# Patient Record
Sex: Female | Born: 1935 | Race: Black or African American | Hispanic: No | Smoking: Never smoker
Health system: Southern US, Community
[De-identification: ages and names within clinical notes are randomized; demographics above are authoritative.]

## PROBLEM LIST (undated history)

## (undated) DIAGNOSIS — M6281 Muscle weakness (generalized): Secondary | ICD-10-CM

## (undated) DIAGNOSIS — A809 Acute poliomyelitis, unspecified: Secondary | ICD-10-CM

## (undated) DIAGNOSIS — I509 Heart failure, unspecified: Secondary | ICD-10-CM

## (undated) DIAGNOSIS — Z8781 Personal history of (healed) traumatic fracture: Secondary | ICD-10-CM

## (undated) DIAGNOSIS — L8990 Pressure ulcer of unspecified site, unspecified stage: Secondary | ICD-10-CM

## (undated) DIAGNOSIS — E785 Hyperlipidemia, unspecified: Secondary | ICD-10-CM

## (undated) DIAGNOSIS — I639 Cerebral infarction, unspecified: Secondary | ICD-10-CM

## (undated) DIAGNOSIS — I4891 Unspecified atrial fibrillation: Secondary | ICD-10-CM

## (undated) DIAGNOSIS — M1990 Unspecified osteoarthritis, unspecified site: Secondary | ICD-10-CM

## (undated) DIAGNOSIS — I10 Essential (primary) hypertension: Secondary | ICD-10-CM

## (undated) DIAGNOSIS — R1310 Dysphagia, unspecified: Secondary | ICD-10-CM

## (undated) DIAGNOSIS — G809 Cerebral palsy, unspecified: Secondary | ICD-10-CM

## (undated) DIAGNOSIS — K219 Gastro-esophageal reflux disease without esophagitis: Secondary | ICD-10-CM

## (undated) DIAGNOSIS — E46 Unspecified protein-calorie malnutrition: Secondary | ICD-10-CM

## (undated) DIAGNOSIS — M810 Age-related osteoporosis without current pathological fracture: Secondary | ICD-10-CM

## (undated) DIAGNOSIS — R296 Repeated falls: Secondary | ICD-10-CM

## (undated) HISTORY — PX: HIP PINNING: SHX1757

---

## 2002-12-26 ENCOUNTER — Ambulatory Visit (HOSPITAL_COMMUNITY): Admission: RE | Admit: 2002-12-26 | Discharge: 2002-12-26 | Payer: Self-pay | Admitting: Pulmonary Disease

## 2004-03-12 ENCOUNTER — Ambulatory Visit (HOSPITAL_COMMUNITY): Admission: RE | Admit: 2004-03-12 | Discharge: 2004-03-12 | Payer: Self-pay | Admitting: Pulmonary Disease

## 2005-01-26 ENCOUNTER — Ambulatory Visit (HOSPITAL_COMMUNITY): Admission: RE | Admit: 2005-01-26 | Discharge: 2005-01-26 | Payer: Self-pay | Admitting: Pulmonary Disease

## 2010-03-13 ENCOUNTER — Emergency Department (HOSPITAL_COMMUNITY)
Admission: EM | Admit: 2010-03-13 | Discharge: 2010-03-13 | Payer: Self-pay | Source: Home / Self Care | Admitting: Emergency Medicine

## 2010-03-14 ENCOUNTER — Emergency Department (HOSPITAL_COMMUNITY)
Admission: EM | Admit: 2010-03-14 | Discharge: 2010-03-14 | Payer: Self-pay | Source: Home / Self Care | Admitting: Physician Assistant

## 2010-03-24 ENCOUNTER — Inpatient Hospital Stay (HOSPITAL_COMMUNITY)
Admission: AD | Admit: 2010-03-24 | Discharge: 2010-03-27 | Payer: Self-pay | Source: Home / Self Care | Attending: Pulmonary Disease | Admitting: Pulmonary Disease

## 2010-03-24 LAB — CBC
HCT: 41.8 % (ref 36.0–46.0)
Hemoglobin: 13.8 g/dL (ref 12.0–15.0)
MCH: 27.8 pg (ref 26.0–34.0)
MCHC: 33 g/dL (ref 30.0–36.0)
MCV: 84.3 fL (ref 78.0–100.0)
Platelets: 272 10*3/uL (ref 150–400)
RBC: 4.96 MIL/uL (ref 3.87–5.11)
RDW: 13.7 % (ref 11.5–15.5)
WBC: 4 10*3/uL (ref 4.0–10.5)

## 2010-03-24 LAB — BRAIN NATRIURETIC PEPTIDE: Pro B Natriuretic peptide (BNP): 57.7 pg/mL (ref 0.0–100.0)

## 2010-03-24 LAB — COMPREHENSIVE METABOLIC PANEL
ALT: 12 U/L (ref 0–35)
AST: 18 U/L (ref 0–37)
Albumin: 3.5 g/dL (ref 3.5–5.2)
Alkaline Phosphatase: 165 U/L — ABNORMAL HIGH (ref 39–117)
BUN: 16 mg/dL (ref 6–23)
CO2: 31 mEq/L (ref 19–32)
Calcium: 10 mg/dL (ref 8.4–10.5)
Chloride: 109 mEq/L (ref 96–112)
Creatinine, Ser: 0.84 mg/dL (ref 0.4–1.2)
GFR calc Af Amer: >60 mL/min (ref >60)
GFR calc non Af Amer: >60 mL/min (ref >60)
Glucose, Bld: 87 mg/dL (ref 70–99)
Potassium: 5.6 mEq/L — ABNORMAL HIGH (ref 3.5–5.1)
Sodium: 142 mEq/L (ref 135–145)
Total Bilirubin: 0.4 mg/dL (ref 0.3–1.2)
Total Protein: 7.5 g/dL (ref 6.0–8.3)

## 2010-03-24 LAB — DIFFERENTIAL
Basophils Absolute: 0 10*3/uL (ref 0.0–0.1)
Basophils Relative: 0 % (ref 0–1)
Eosinophils Absolute: 0.1 10*3/uL (ref 0.0–0.7)
Eosinophils Relative: 2 % (ref 0–5)
Lymphocytes Relative: 29 % (ref 12–46)
Lymphs Abs: 1.2 10*3/uL (ref 0.7–4.0)
Monocytes Absolute: 0.4 10*3/uL (ref 0.1–1.0)
Monocytes Relative: 10 % (ref 3–12)
Neutro Abs: 2.4 10*3/uL (ref 1.7–7.7)
Neutrophils Relative %: 60 % (ref 43–77)

## 2010-03-26 ENCOUNTER — Encounter: Payer: Self-pay | Admitting: Orthopedic Surgery

## 2010-03-27 ENCOUNTER — Encounter: Payer: Self-pay | Admitting: Orthopedic Surgery

## 2010-03-27 LAB — POTASSIUM: Potassium: 3.6 mEq/L (ref 3.5–5.1)

## 2010-03-27 NOTE — Progress Notes (Signed)
  NAME:  Traci Martin                ACCOUNT NO.:  [id number]  MEDICAL RECORD NO.:  [id number]          PATIENT TYPE:  INP  LOCATION:  A318                          FACILITY:  [facility]  PHYSICIAN:  Joshuah Minella L. Juanetta Gosling, M.D.DATE OF BIRTH:  1924/11/17  DATE OF PROCEDURE: DATE OF DISCHARGE:                                PROGRESS NOTE   Traci Martin is doing fairly well.  She had difficulty bearing weight yesterday.  I had another x-ray made of her femur.  I have discussed her situation with Dr. Romeo Apple who has seen her in consult and he does not see a fracture but we are still concerned about the fact that she is not able to bear weight which she had been able to do in the past.  We are going to see about getting a bone scan.  She is being set up for placement and I think that may be able to be accomplished in the next 24 hours depending on the results of the bone scan.  Her exam shows that she is awake and alert, mildly confused.  Temperature is 98.8, pulse 85, respirations 16, blood pressure 139/86, O2 sats 96% on room air.  Her chest is clear.  Her heart is regular.  Her abdomen is soft.  Assessment then she has multiple medical problems, history of congestive heart failure which seems to be pretty inactive at this point, at least she is not having active symptoms.  She has failure to thrive.  She has inability to ambulate.  She has cerebral palsy and my plan will be to continue with her treatments etc for the bone scan potential discharge tomorrow.     Traci Martin L. Juanetta Gosling, M.D.     ELH/MEDQ  D:  03/26/2010  T:  03/26/2010  Job:  [id number]  Electronically Signed by Kari Baars M.D. on 03/27/2010 10:54:31 AM

## 2010-03-27 NOTE — H&P (Signed)
  NAME:  [name], [name]                ACCOUNT NO.:  [id number]  MEDICAL RECORD NO.:  [id number]          PATIENT TYPE:  INP  LOCATION:  A318                          FACILITY:  [facility]  PHYSICIAN:  Stetson Pelaez L. Juanetta Gosling, M.D.DATE OF BIRTH:  1924-08-13  DATE OF ADMISSION:  03/24/2010 DATE OF DISCHARGE:  LH                             HISTORY & PHYSICAL   Traci Martin is admitted because of failure to thrive and uncontrolled pain. She is 77 year old.  She had apparently fallen at home about 10 days and 2 weeks ago.  She lost her balance and she went to the emergency room, had x-rays there which were negative.  Since that time, she has continued to have pain which is not controlled.  I saw her in my office last week, arranged for home health to go and help, they found that she was alone, unable to get up and she had loss control of her bowels.  She is being admitted for pain control and failure to thrive.  Her past medical history is positive for hyperlipidemia, hypertension, congestive heart failure, cerebral palsy and falls.  Her family history is positive for anemia and coronary artery disease.  Surgically, she has had a hip replacement.  SOCIAL HISTORY:  She is living alone which I think is unsafe for her. She does not use any tobacco, alcohol or illicit drugs.  Her medications are unclear at this point.  Review of systems except as mentioned is negative.  Physical examination shows that she appears to be mildly confused, blood pressure 120/74, pulse is 80 and respirations were 16.  Her pupils are reactive.  She has some disconjugate gaze.  Her heart is regular. Abdomen is soft.  Bowel sounds present and active.  Her chest is clear without wheezes, rales or rhonchi.  She has chronic abnormalities in her central nervous system examination with the cerebral palsy.  Assessment then is that she has pain which is uncontrolled.  She has failure to thrive.  I am going to bring her in the  hospital, put her on IV pain medication, try to get her pain control.  I am going to see if we get PT, etc. to see her and continue with everything else in the meantime.     Brinleigh Tew L. Juanetta Gosling, M.D.     ELH/MEDQ  D:  03/24/2010  T:  03/25/2010  Job:  [id number]  Electronically Signed by Kari Baars M.D. on 03/27/2010 10:54:20 AM

## 2010-03-27 NOTE — Progress Notes (Signed)
  NAME:  Traci, Martin                ACCOUNT NO.:  [id number]  MEDICAL RECORD NO.:  [id number]          PATIENT TYPE:  INP  LOCATION:  A318                          FACILITY:  [facility]  PHYSICIAN:  Edward L. Juanetta Gosling, M.D.DATE OF BIRTH:  Dec 20, 1924  DATE OF PROCEDURE: DATE OF DISCHARGE:                                PROGRESS NOTE   [name] was admitted yesterday with pain from a fall, failure to thrive.  She seems to be a little bit better this morning.  PHYSICAL EXAMINATION:  VITAL SIGNS:  She has a temperature of 98.8, pulse 87, respirations 18, blood pressure 158/83, O2 sats 97%. GENERAL:  She is still complaining of pain.  She is awake and alert. She looks a little better than last night.  ASSESSMENT:  I think she is improving.  Plan is to continue with her current treatments and medications.  No changes today.  Continue IV fluids, etc.     Edward L. Juanetta Gosling, M.D.     ELH/MEDQ  D:  03/25/2010  T:  03/26/2010  Job:  [id number] Electronically Signed by Kari Baars M.D. on 03/27/2010 10:54:24 AM

## 2010-03-29 NOTE — Progress Notes (Addendum)
  NAME:  Traci Martin, Traci Martin                ACCOUNT NO.:  [id number]  MEDICAL RECORD NO.:  [id number]          PATIENT TYPE:  INP  LOCATION:  A318                          FACILITY:  [facility]  PHYSICIAN:  Vickki Hearing, M.D.DATE OF BIRTH:  07/03/1924  DATE OF PROCEDURE: DATE OF DISCHARGE:                                PROGRESS NOTE   ADDENDUM:  Bone scan was done today.  The patient has increased uptake in the left pelvis.  Based on her clinical history of fall, inability to weight bear on the left side, I suspect that she has a left pelvic fracture and we will treat it as such.     Vickki Hearing, M.D.     SEH/MEDQ  D:  03/26/2010  T:  03/27/2010  Job:  [id number]  Electronically Signed by Fuller Canada M.D. on 03/29/2010 04:43:42 PM

## 2010-03-29 NOTE — Consult Note (Addendum)
NAME:  Traci Martin, STAPP                ACCOUNT NO.:  [id number]  MEDICAL RECORD NO.:  [id number]          PATIENT TYPE:  INP  LOCATION:  A318                          FACILITY:  [facility]  PHYSICIAN:  Vickki Hearing, M.D.DATE OF BIRTH:  Nov 28, 1924  DATE OF CONSULTATION:  03/26/2010 DATE OF DISCHARGE:                                CONSULTATION   REFERRING PHYSICIAN:  Edward L. Juanetta Gosling, MD  REASON FOR CONSULTATION:  Joint pain and left leg pain status post fall, status post open treatment internal fixation, left hip with cannulated screws.  The patient took a fall approximately 2 weeks ago prior to admission, presented with pain and failure to thrive.  Apparently, she lost her balance.  She was seen at that time and x-rays were done and they were normal.  After that, she had uncontrolled pain.  She was seen at the primary care physician's office.  Home Health was arranged but when they came in on the day of admission, she was found alone and unable to get up.  She has a history of hyperlipidemia, hypertension, congestive heart failure, listed as cerebral palsy but she says she had polio.  FAMILY HISTORY:  Positive for anemia, coronary artery disease.  She has had left hip cannulated screws.  SOCIAL HISTORY:  She does live alone.  She does not drink or smoke.  MEDICATIONS:  Listed as Lovenox, Lasix, Protonix, Altace, and Dilaudid.  REVIEW OF SYSTEMS:  Generalized aches and pains, deformities in left upper and left lower extremity.  PHYSICAL EXAMINATION:  VITAL SIGNS:  98.8 temp, pulse 85, respiratory rate 16, blood pressure 139/86, O2 sat 96%. GENERAL:  She is a small-framed lady.  She is noted to have multiple deformities of her left upper extremity and left lower extremity with atrophy noted. CARDIOVASCULAR:  Normal. SKIN:  Intact and normal. LYMPH NODES:  Cervical area normal. EXTREMITIES:  Gait was not assessed. MUSCULOSKELETAL:  She really had no painful areas to  palpation.  She had atrophy in the left upper and lower extremity.  Normal muscle tone on the right upper and lower extremity.  She had deformities of the foot and hand on the left side, normal on the right side.  No subluxation of the right or left upper extremity joints was noted and strength and grip and plantar flexion, dorsiflexion were normal.  Radiographs of her left hip show 3 cannulated screws, no new fracture. Pelvis is also normal.  She had some rib fractures noted on the left side.  Clavicle was normal.  Tibia and fibula x-rays were normal from March 14, 2010.  Pelvic x-ray from March 14, 2010, was also normal and left shoulder x-ray from March 13, 2010, was normal.  IMPRESSION: 1. Failure to thrive. 2. Uncontrolled pain. 3. Rib fractures.  Control pain.  No followups necessary unless the patient develops fracture.  The internal fixation of the left hip seems to be old. Fractures healed normally.  There is no sign of avascular necrosis or nonunion.     Vickki Hearing, M.D.     SEH/MEDQ  D:  03/26/2010  T:  03/26/2010  Job:  [id number]  Electronically Signed by Fuller Canada M.D. on 03/29/2010 04:43:40 PM

## 2010-04-01 ENCOUNTER — Encounter: Payer: Self-pay | Admitting: Orthopedic Surgery

## 2010-04-02 NOTE — Discharge Summary (Signed)
NAME:  Traci Martin, Traci Martin                ACCOUNT NO.:  [id number]  MEDICAL RECORD NO.:  [id number]           PATIENT TYPE:  LOCATION:                                 FACILITY:  PHYSICIAN:  Abhimanyu Cruces L. Juanetta Gosling, M.D.DATE OF BIRTH:  [birth year]  DATE OF ADMISSION: DATE OF DISCHARGE:  LH                         DISCHARGE SUMMARY-REFERRING   FINAL DISCHARGE DIAGNOSES: 1. Presumed left pelvic fracture. 2. Failure to thrive. 3. Uncontrolled pain. 4. Congestive heart failure. 5. Cerebral palsy. 6. Multiple falls. 7. Hyperlipidemia. 8. Hypertension. 9. Hyperkalemia. 10.Osteoporosis.  HISTORY:  [name] is an 77 year old who had a fall at home at about 10 days and again about 2 weeks ago.  She had lost her balance, got in to the Emergency Room and had a number of x-rays which were negative. Since that time, she has continued to have pain in her upper and lower body, which is not controlled.  I saw her in my office last week, had arranged for home health services when home health went out, they found that she was unable to get out of bed.  She was alone and she had lost control of her bowels.  She is being admitted for pain control and failure to thrive and inability to ambulate.  Her physical examination shows a thin female who is mildly confused, blood pressure 120/74, pulse 80, and respirations 16.  She has chronic changes with left hemiparesis and some contractures from cerebral palsy.  She has mild disconjugate gaze.  She was felt to have uncontrolled pain and there was concern about an occult fracture.  Her potassium was elevated on admission.  BNP was 57, potassium was 5.6, and alkaline phosphatase was 165.  Her CBC showed white count of 4000, hemoglobin 13.8, and platelets 272.  HOSPITAL COURSE:  She had PT consultation and was found that she was unable to bear weight on her left leg.  This is the leg that is affected by her cerebral palsy, but she has been able to walk with a  walker or cane previously.  Consultation was obtained with Dr. Romeo Apple and she had further x-rays, which did not show a fracture.  Because she was still having difficulty, she underwent a bone scan which showed uptake in the left pelvis area and it was felt that she had had a left pelvic fracture that was not visible on x-ray.  Probably because she has pretty severe osteoporosis.  She is going to be transferred to a skilled care facility where she will have PT/OT and speech as needed.  She will be on Altace 2.5 mg daily, Protonix 40 mg daily, Lasix 40 mg daily, and Lovenox 40 mg subcu daily until she is fully ambulatory.  She will need a followup appointment with Dr. Romeo Apple in approximately a week.  She will be on a no added salt, low potassium diet.  She will be on hydrocodone 5/325 q.4 h. p.r.n. pain.  Eventual discharge plan is that she will probably need an assisted- living facility level of care.  She will be able to use a walker, but had no weightbearing for 3 more days and  then discussion with Dr. Romeo Apple about when she is going to be able to bear weight.     Chinonso Linker L. Juanetta Gosling, M.D.     ELH/MEDQ  D:  03/27/2010  T:  03/27/2010  Job:  [id number]  Electronically Signed by Kari Baars M.D. on 04/02/2010 08:20:36 AM

## 2010-04-02 NOTE — Progress Notes (Signed)
  NAME:  Traci Martin, NEEDLES                ACCOUNT NO.:  [id number]  MEDICAL RECORD NO.:  [id number]          PATIENT TYPE:  INP  LOCATION:  A318                          FACILITY:  [facility]  PHYSICIAN:  Saraiya Kozma L. Juanetta Gosling, M.D.DATE OF BIRTH:  [birth year]  DATE OF PROCEDURE: DATE OF DISCHARGE:  03/27/2010                                PROGRESS NOTE   Ms. [name] is doing better.  She did have what looks like it maybe a pelvic fracture by bone scan.  She is being treated for that.  Her exam shows her temperature is 98.4, pulse 74, respirations 18, blood pressure 127/75, O2 sats 92% on room air.  Her chest is clear.  She looks very comfortable.  Her heart is regular.  ASSESSMENT:  She is better, but does have a pelvic fracture, now we at least know why she is not able to ambulate.  She has been off for a skilled care facility bed, and I am going to plan to discharge her today to the skilled care facility and follow.     Emonie Espericueta L. Juanetta Gosling, M.D.     ELH/MEDQ  D:  03/27/2010  T:  03/28/2010  Job:  [id number]  Electronically Signed by Kari Baars M.D. on 04/02/2010 08:20:38 AM

## 2010-04-07 NOTE — Letter (Signed)
Summary: Discharge Summary Dr Juanetta Gosling   Discharge Summary Dr Juanetta Gosling   Imported By: Cammie Sickle 04/02/2010 14:00:59  _____________________________________________________________________  External Attachment:    Type:   Image     Comment:   External Document

## 2010-04-07 NOTE — Consult Note (Signed)
Summary: Hospital consult  s/p fall  Hospital consult  s/p fall   Imported By: Cammie Sickle 03/31/2010 20:55:27  _____________________________________________________________________  External Attachment:    Type:   Image     Comment:   External Document

## 2010-04-21 NOTE — Miscellaneous (Signed)
Summary: Nursing Home order  Nursing Home order   Imported By: Cammie Sickle 04/14/2010 12:50:58  _____________________________________________________________________  External Attachment:    Type:   Image     Comment:   External Document

## 2010-04-29 ENCOUNTER — Encounter: Payer: Self-pay | Admitting: Orthopedic Surgery

## 2010-05-12 ENCOUNTER — Encounter: Payer: Self-pay | Admitting: Orthopedic Surgery

## 2010-05-12 ENCOUNTER — Ambulatory Visit (INDEPENDENT_AMBULATORY_CARE_PROVIDER_SITE_OTHER): Payer: Medicare Other | Admitting: Orthopedic Surgery

## 2010-05-12 DIAGNOSIS — S329XXA Fracture of unspecified parts of lumbosacral spine and pelvis, initial encounter for closed fracture: Secondary | ICD-10-CM

## 2010-05-18 NOTE — Assessment & Plan Note (Signed)
Summary: HOSP FOL/UP/6 WK RE-CK PELVIS/XRAY AP PRIOR TO APPT/MEDICARE/...   Visit Type:  Follow-up  CC:  hip pain.  History of Present Illness: I saw Traci Martin in the office today for a followup visit.  She is a 77 years old woman with the complaint of:  left hip pain.  Follow up today from hospital.  Pelvis xray taken 03/14/10.  Bone scan taken 03/26/10  did show a increased uptake in the LEFT pelvis and, so was treated as a fracture with progress. Her weightbearing as tolerated. Patient now walking with a walker complains of no pain.  I did notice that she has a hemiparetic LEFT side and would benefit from a custom made shoe.       Impression & Recommendations:  Problem # 1:  FRACTURE, PELVIS, LEFT (ICD-808.8) Assessment Comment Only  Orders: Post-Op Check (04540)  Patient Instructions: 1)  no f/u needed    Orders Added: 1)  Post-Op Check [98119]

## 2010-05-18 NOTE — Medication Information (Signed)
Summary: Tax adviser   Imported By: Cammie Sickle 05/12/2010 19:07:20  _____________________________________________________________________  External Attachment:    Type:   Image     Comment:   External Document

## 2010-07-14 ENCOUNTER — Other Ambulatory Visit (HOSPITAL_COMMUNITY): Payer: Self-pay | Admitting: Pulmonary Disease

## 2010-07-14 DIAGNOSIS — M1990 Unspecified osteoarthritis, unspecified site: Secondary | ICD-10-CM

## 2010-07-20 ENCOUNTER — Ambulatory Visit (HOSPITAL_COMMUNITY)
Admission: RE | Admit: 2010-07-20 | Discharge: 2010-07-20 | Disposition: A | Discharge status: Home / Self Care | Payer: Medicare Other | Source: Ambulatory Visit | Attending: Pulmonary Disease | Admitting: Pulmonary Disease

## 2010-07-20 DIAGNOSIS — M1990 Unspecified osteoarthritis, unspecified site: Secondary | ICD-10-CM

## 2010-07-20 DIAGNOSIS — Z780 Asymptomatic menopausal state: Secondary | ICD-10-CM | POA: Insufficient documentation

## 2010-07-20 DIAGNOSIS — M818 Other osteoporosis without current pathological fracture: Secondary | ICD-10-CM | POA: Insufficient documentation

## 2012-03-09 ENCOUNTER — Inpatient Hospital Stay (HOSPITAL_COMMUNITY)
Admission: EM | Admit: 2012-03-09 | Discharge: 2012-03-14 | DRG: 065 | Disposition: A | Discharge status: Skilled Nursing Facility | Payer: PRIVATE HEALTH INSURANCE | Attending: Pulmonary Disease | Admitting: Pulmonary Disease

## 2012-03-09 ENCOUNTER — Emergency Department (HOSPITAL_COMMUNITY): Payer: PRIVATE HEALTH INSURANCE

## 2012-03-09 ENCOUNTER — Other Ambulatory Visit: Payer: Self-pay

## 2012-03-09 ENCOUNTER — Encounter (HOSPITAL_COMMUNITY): Payer: Self-pay | Admitting: Emergency Medicine

## 2012-03-09 DIAGNOSIS — G809 Cerebral palsy, unspecified: Secondary | ICD-10-CM | POA: Diagnosis present

## 2012-03-09 DIAGNOSIS — G9389 Other specified disorders of brain: Secondary | ICD-10-CM | POA: Diagnosis present

## 2012-03-09 DIAGNOSIS — I10 Essential (primary) hypertension: Secondary | ICD-10-CM | POA: Diagnosis present

## 2012-03-09 DIAGNOSIS — R1310 Dysphagia, unspecified: Secondary | ICD-10-CM | POA: Diagnosis present

## 2012-03-09 DIAGNOSIS — M6282 Rhabdomyolysis: Secondary | ICD-10-CM | POA: Diagnosis present

## 2012-03-09 DIAGNOSIS — R5381 Other malaise: Secondary | ICD-10-CM | POA: Diagnosis present

## 2012-03-09 DIAGNOSIS — I4891 Unspecified atrial fibrillation: Secondary | ICD-10-CM | POA: Diagnosis present

## 2012-03-09 DIAGNOSIS — IMO0002 Reserved for concepts with insufficient information to code with codable children: Secondary | ICD-10-CM | POA: Diagnosis present

## 2012-03-09 DIAGNOSIS — I509 Heart failure, unspecified: Secondary | ICD-10-CM | POA: Diagnosis present

## 2012-03-09 DIAGNOSIS — W19XXXA Unspecified fall, initial encounter: Secondary | ICD-10-CM | POA: Diagnosis present

## 2012-03-09 DIAGNOSIS — Y92009 Unspecified place in unspecified non-institutional (private) residence as the place of occurrence of the external cause: Secondary | ICD-10-CM

## 2012-03-09 DIAGNOSIS — M1990 Unspecified osteoarthritis, unspecified site: Secondary | ICD-10-CM | POA: Diagnosis present

## 2012-03-09 DIAGNOSIS — R531 Weakness: Secondary | ICD-10-CM | POA: Diagnosis present

## 2012-03-09 DIAGNOSIS — E860 Dehydration: Secondary | ICD-10-CM | POA: Diagnosis present

## 2012-03-09 DIAGNOSIS — E785 Hyperlipidemia, unspecified: Secondary | ICD-10-CM | POA: Diagnosis present

## 2012-03-09 DIAGNOSIS — I6350 Cerebral infarction due to unspecified occlusion or stenosis of unspecified cerebral artery: Principal | ICD-10-CM | POA: Diagnosis present

## 2012-03-09 HISTORY — DX: Age-related osteoporosis without current pathological fracture: M81.0

## 2012-03-09 HISTORY — DX: Personal history of (healed) traumatic fracture: Z87.81

## 2012-03-09 HISTORY — DX: Repeated falls: R29.6

## 2012-03-09 HISTORY — DX: Heart failure, unspecified: I50.9

## 2012-03-09 HISTORY — DX: Essential (primary) hypertension: I10

## 2012-03-09 HISTORY — DX: Hyperlipidemia, unspecified: E78.5

## 2012-03-09 HISTORY — DX: Cerebral palsy, unspecified: G80.9

## 2012-03-09 HISTORY — DX: Unspecified osteoarthritis, unspecified site: M19.90

## 2012-03-09 LAB — BASIC METABOLIC PANEL
BUN: 14 mg/dL (ref 6–23)
Chloride: 103 mEq/L (ref 96–112)
GFR calc Af Amer: 76 mL/min — ABNORMAL LOW (ref >90)
Glucose, Bld: 108 mg/dL — ABNORMAL HIGH (ref 70–99)
Potassium: 3.5 mEq/L (ref 3.5–5.1)

## 2012-03-09 LAB — URINALYSIS, ROUTINE W REFLEX MICROSCOPIC
Glucose, UA: NEGATIVE mg/dL
Hgb urine dipstick: NEGATIVE
Specific Gravity, Urine: >1.03 — ABNORMAL HIGH (ref 1.005–1.030)
Urobilinogen, UA: 0.2 mg/dL (ref 0.0–1.0)

## 2012-03-09 LAB — CBC WITH DIFFERENTIAL/PLATELET
Basophils Relative: 0 % (ref 0–1)
HCT: 44.3 % (ref 36.0–46.0)
Hemoglobin: 14.1 g/dL (ref 12.0–15.0)
MCH: 27 pg (ref 26.0–34.0)
MCHC: 31.8 g/dL (ref 30.0–36.0)
Monocytes Absolute: 0.5 10*3/uL (ref 0.1–1.0)
Monocytes Relative: 9 % (ref 3–12)
Neutro Abs: 3.6 10*3/uL (ref 1.7–7.7)

## 2012-03-09 MED ORDER — ALUM & MAG HYDROXIDE-SIMETH 200-200-20 MG/5ML PO SUSP: 30.0000 mL | Freq: Four times a day (QID) | ORAL | Status: DC | PRN

## 2012-03-09 MED ORDER — ONDANSETRON HCL 4 MG PO TABS: 4.0000 mg | ORAL_TABLET | Freq: Four times a day (QID) | ORAL | Status: DC | PRN

## 2012-03-09 MED ORDER — ACETAMINOPHEN 650 MG RE SUPP: 650.0000 mg | Freq: Four times a day (QID) | RECTAL | Status: DC | PRN

## 2012-03-09 MED ORDER — SODIUM CHLORIDE 0.9 % IV BOLUS (SEPSIS)
250.0000 mL | Freq: Once | INTRAVENOUS | Status: AC
  Administered 2012-03-09: 250 mL via INTRAVENOUS

## 2012-03-09 MED ORDER — ACETAMINOPHEN 325 MG PO TABS: 650.0000 mg | ORAL_TABLET | Freq: Four times a day (QID) | ORAL | Status: DC | PRN

## 2012-03-09 MED ORDER — SODIUM CHLORIDE 0.9 % IV SOLN
INTRAVENOUS | Status: DC
  Administered 2012-03-09 – 2012-03-13 (×5): via INTRAVENOUS

## 2012-03-09 MED ORDER — SODIUM CHLORIDE 0.9 % IV SOLN
INTRAVENOUS | Status: DC
  Administered 2012-03-09 – 2012-03-10 (×2): via INTRAVENOUS

## 2012-03-09 MED ORDER — ONDANSETRON HCL 4 MG/2ML IJ SOLN: 4.0000 mg | Freq: Four times a day (QID) | INTRAMUSCULAR | Status: DC | PRN

## 2012-03-09 MED ORDER — DILTIAZEM HCL 30 MG PO TABS
30.0000 mg | ORAL_TABLET | Freq: Four times a day (QID) | ORAL | Status: DC
  Administered 2012-03-09 – 2012-03-14 (×19): 30 mg via ORAL
  Filled 2012-03-09 (×20): qty 1

## 2012-03-09 MED ORDER — ENOXAPARIN SODIUM 40 MG/0.4ML ~~LOC~~ SOLN
40.0000 mg | SUBCUTANEOUS | Status: DC
  Administered 2012-03-09: 40 mg via SUBCUTANEOUS
  Filled 2012-03-09: qty 0.4

## 2012-03-09 NOTE — ED Provider Notes (Signed)
History     CSN: [id number]  Arrival date & time 03/09/12  1010   First MD Initiated Contact with Patient 03/09/12 1120      Chief Complaint  Patient presents with  . Fall     HPI Pt was seen at 1125.  Per EMS and pt report, pt c/o sudden onset and resolution of one episode of falling that occurred an unknown time ago.  EMS states pt's Home Health Aide found her lying face up on the floor today PTA.  She told EMS that she last saw patient yesterday at approx 1030am.  Pt states she was walking with her walker and "just fell" because she "felt weak."  Does not know if she fell yesterday or today.  Denies syncope, denies LOC, denies CP/SOB, no abd pain, no N/V/D, no neck or back pain, no tingling/numbness in extremities, no focal motor weakness.     Past Medical History  Diagnosis Date  . Hypertension   . Hyperlipidemia   . Cerebral palsy   . Frequent falls   . CHF (congestive heart failure)   . Osteoporosis   . History of pelvic fracture   . Arthritis     Past Surgical History  Procedure Date  . Hip pinning      History  Substance Use Topics  . Smoking status: Never Smoker   . Smokeless tobacco: Former Neurosurgeon    Types: Snuff  . Alcohol Use: No    Review of Systems ROS: Statement: All systems negative except as marked or noted in the HPI; Constitutional: Negative for fever and chills. ; ; Eyes: Negative for eye pain, redness and discharge. ; ; ENMT: Negative for ear pain, hoarseness, nasal congestion, sinus pressure and sore throat. ; ; Cardiovascular: Negative for chest pain, palpitations, diaphoresis, dyspnea and peripheral edema. ; ; Respiratory: Negative for cough, wheezing and stridor. ; ; Gastrointestinal: Negative for nausea, vomiting, diarrhea, abdominal pain, blood in stool, hematemesis, jaundice and rectal bleeding. . ; ; Genitourinary: Negative for dysuria, flank pain and hematuria. ; ; Musculoskeletal: Negative for back pain and neck pain. Negative for swelling and  trauma.; ; Skin: +abrasions. Negative for pruritus, rash, blisters, bruising and skin lesion.; ; Neuro: +generalized weakness. Negative for headache, lightheadedness and neck stiffness. Negative for altered level of consciousness , altered mental status, extremity weakness, paresthesias, involuntary movement, seizure and syncope.      Allergies  Review of patient's allergies indicates no known allergies.  Home Medications  No current outpatient prescriptions on file.  BP 153/93  Pulse 78  Temp 99.3 F (37.4 C) (Rectal)  Resp 17  SpO2 95%  Physical Exam 1130: Physical examination:  Nursing notes reviewed; Vital signs and O2 SAT reviewed;  Constitutional: Well developed, Well nourished, In no acute distress; Head:  Normocephalic, atraumatic; Eyes: EOMI, PERRL, No scleral icterus. +right eye with disconjugate gaze.; ENMT: Mouth and pharynx normal, Mucous membranes dry; Neck: Supple, Full range of motion, No lymphadenopathy; Cardiovascular: Irregular irregular rate and rhythm, No gallop; Respiratory: Breath sounds clear & equal bilaterally, No rales, rhonchi, wheezes.  Speaking full sentences with ease, Normal respiratory effort/excursion; Chest: Nontender, Movement normal; Abdomen: Soft, Nontender, Nondistended, Normal bowel sounds; Genitourinary: No CVA tenderness; Spine:  No midline CS, TS, LS tenderness.;; Extremities: Pelvis stable. Pulses normal, No tenderness, No edema, No calf edema or asymmetry.; Neuro: AA&Ox3, mildly confused regarding events. Major CN grossly intact.  +edentulous, speech slurred. +LUE contracted, otherwise moves all extremities on stretcher spontaneously and to command without  apparent gross focal motor deficits.; Skin: Color normal, Warm, Dry, +multiple superficial abrasions to bilat UE's and LE's.   ED Course  Procedures     MDM  MDM Reviewed: previous chart, nursing note and vitals Interpretation: ECG, labs, x-ray and CT scan    Date: 03/09/2012  Rate:  109  Rhythm: atrial fibrillation  QRS Axis: normal  Intervals: normal  ST/T Wave abnormalities: nonspecific ST/T changes  Conduction Disutrbances:none  Narrative Interpretation: LVH  Old EKG Reviewed: none available  Results for orders placed during the hospital encounter of 03/09/12  BASIC METABOLIC PANEL      Component Value Range   Sodium 141  135 - 145 mEq/L   Potassium 3.5  3.5 - 5.1 mEq/L   Chloride 103  96 - 112 mEq/L   CO2 28  19 - 32 mEq/L   Glucose, Bld 108 (*) 70 - 99 mg/dL   BUN 14  6 - 23 mg/dL   Creatinine, Ser 1.61  0.50 - 1.10 mg/dL   Calcium 09.6  8.4 - 04.5 mg/dL   GFR calc non Af Amer 65 (*) >90 mL/min   GFR calc Af Amer 76 (*) >90 mL/min  CBC WITH DIFFERENTIAL      Component Value Range   WBC 5.4  4.0 - 10.5 K/uL   RBC 5.23 (*) 3.87 - 5.11 MIL/uL   Hemoglobin 14.1  12.0 - 15.0 g/dL   HCT 40.9  81.1 - 91.4 %   MCV 84.7  78.0 - 100.0 fL   MCH 27.0  26.0 - 34.0 pg   MCHC 31.8  30.0 - 36.0 g/dL   RDW 78.2  95.6 - 21.3 %   Platelets 195  150 - 400 K/uL   Neutrophils Relative 66  43 - 77 %   Neutro Abs 3.6  1.7 - 7.7 K/uL   Lymphocytes Relative 24  12 - 46 %   Lymphs Abs 1.3  0.7 - 4.0 K/uL   Monocytes Relative 9  3 - 12 %   Monocytes Absolute 0.5  0.1 - 1.0 K/uL   Eosinophils Relative 1  0 - 5 %   Eosinophils Absolute 0.0  0.0 - 0.7 K/uL   Basophils Relative 0  0 - 1 %   Basophils Absolute 0.0  0.0 - 0.1 K/uL  URINALYSIS, ROUTINE W REFLEX MICROSCOPIC      Component Value Range   Color, Urine YELLOW  YELLOW   APPearance CLEAR  CLEAR   Specific Gravity, Urine >1.030 (*) 1.005 - 1.030   pH 5.5  5.0 - 8.0   Glucose, UA NEGATIVE  NEGATIVE mg/dL   Hgb urine dipstick NEGATIVE  NEGATIVE   Bilirubin Urine SMALL (*) NEGATIVE   Ketones, ur NEGATIVE  NEGATIVE mg/dL   Protein, ur 30 (*) NEGATIVE mg/dL   Urobilinogen, UA 0.2  0.0 - 1.0 mg/dL   Nitrite NEGATIVE  NEGATIVE   Leukocytes, UA NEGATIVE  NEGATIVE  TROPONIN I      Component Value Range   Troponin I  <0.30  <0.30 ng/mL  LACTIC ACID, PLASMA      Component Value Range   Lactic Acid, Venous 2.0  0.5 - 2.2 mmol/L  CK      Component Value Range   Total CK 1210 (*) 7 - 177 U/L  URINE MICROSCOPIC-ADD ON      Component Value Range   Squamous Epithelial / LPF RARE  RARE   WBC, UA 0-2  <3 WBC/hpf   Dg Chest 1  View 03/09/2012  *RADIOLOGY REPORT*  Clinical Data: Fall  CHEST - 1 VIEW  Comparison: None  Findings: Enlargement of cardiac silhouette. Rotated to the left. Atherosclerotic calcification aorta. Elevation of left diaphragm with associated left basilar atelectasis. Minimal atelectasis at right base. Upper lungs clear. No pleural effusion or pneumothorax. Bones diffusely demineralized.  IMPRESSION: Elevation of left diaphragm with bibasilar atelectasis. Enlargement of cardiac silhouette.   Original Report Authenticated By: Ulyses Southward, M.D.    Dg Pelvis 1-2 Views 03/09/2012  *RADIOLOGY REPORT*  Clinical Data: Fall  PELVIS - 1-2 VIEW  Comparison: 03/25/2010  Findings: Hardware in the left femoral neck for femoral neck fracture fixation is stable.  No acute fracture.  Deformity is noted.  No breakage of the hardware.  Osteopenia.  Right femoral neck is intact.  Vascular calcifications are noted.  Degenerative changes and scoliosis of the lumbar spine.  IMPRESSION: No acute bony pathology.  Postoperative changes.   Original Report Authenticated By: Jolaine Click, M.D.    Ct Head Wo Contrast 03/09/2012  *RADIOLOGY REPORT*  Clinical Data: Fall a few days ago.  Altered level of consciousness.  CT HEAD WITHOUT CONTRAST  Technique:  Contiguous axial images were obtained from the base of the skull through the vertex without contrast.  Comparison: 01/26/2005.  Findings: Motion degraded exam.  No skull fracture or intracranial hemorrhage.  Remote right frontal craniotomy.  Large area of encephalomalacia right hemisphere with dilated ventricles similar to the prior examination.  No intracranial mass lesion detected on  this unenhanced exam.  No CT evidence of large acute infarct.  Mastoid air cells, middle ear cavities and visualized sinuses are clear.  IMPRESSION: Motion degraded exam.  No skull fracture or intracranial hemorrhage.  Remote right frontal craniotomy.  Large area of encephalomalacia right hemisphere with dilated ventricles similar to the prior examination.   Original Report Authenticated By: Lacy Duverney, M.D.      1530:  CK elevated; was likely laying on the floor for some time. BUN/Cr is normal.  Will dose judicious IVF.  T/C to Dr. Ouida Sills, case discussed, including:  HPI, pertinent PM/SHx, VS/PE, dx testing, ED course and treatment:  Agreeable to admit, requests he will come to ED for eval.         Laray Anger, DO 03/10/12 2035

## 2012-03-09 NOTE — Progress Notes (Signed)
Patient did not have a diet order in. Nurse notified the doctor and new orders were given for a diet

## 2012-03-09 NOTE — ED Notes (Signed)
Per EMS pt lives alone and is seen by a home health aide. Home health aide told EMS that she last saw pt at 1030 am yesterday. Home health aide reported to EMS that she came today and found pt lying face up on the floor. Pt alert and oriented upon arrival to ED. Pt denies pain. No obvious deformity noted to right hip. Pt has small abrasion to right buttock,left shoulder, and two abrasions to left knee. No active bleeding noted. nad at this time.

## 2012-03-09 NOTE — ED Notes (Signed)
Attempted IV x 3 w/out success.

## 2012-03-10 LAB — URINE CULTURE
Colony Count: NO GROWTH
Culture: NO GROWTH

## 2012-03-10 MED ORDER — ENOXAPARIN SODIUM 30 MG/0.3ML ~~LOC~~ SOLN
30.0000 mg | SUBCUTANEOUS | Status: DC
  Administered 2012-03-10 – 2012-03-13 (×4): 30 mg via SUBCUTANEOUS
  Filled 2012-03-10 (×4): qty 0.3

## 2012-03-10 NOTE — Progress Notes (Addendum)
Subjective: she was admitted with weakness and was found to be in atrial fibrillation.this is a new diagnosis. She says she feels better but is still weak she is having trouble feeding herself  Objective: Vital signs in last 24 hours: Temp:  [98.4 F (36.9 C)-99.8 F (37.7 C)] 98.4 F (36.9 C) (01/11 1437) Pulse Rate:  [73-99] 82  (01/11 1437) Resp:  [17-25] 22  (01/11 1437) BP: (131-186)/(73-117) 141/88 mmHg (01/11 1437) SpO2:  [92 %-93 %] 93 % (01/11 1437) Weight:  [43.137 kg (95 lb 1.6 oz)-44.09 kg (97 lb 3.2 oz)] 44.09 kg (97 lb 3.2 oz) (01/11 0430) Weight change:  Last BM Date: 03/10/12  Intake/Output from previous day: 01/10 0701 - 01/11 0700 In: 456.3 [I.V.:456.3] Out: -   PHYSICAL EXAM General appearance: alert and mild distress Resp: clear to auscultation bilaterally Cardio: irregularly irregular rhythm GI: soft, non-tender; bowel sounds normal; no masses,  no organomegaly Extremities: she has findings of cerebral palsy  Lab Results:    Basic Metabolic Panel:  Basename 03/09/12 1214  NA 141  K 3.5  CL 103  CO2 28  GLUCOSE 108*  BUN 14  CREATININE 0.80  CALCIUM 10.1  MG --  PHOS --   Liver Function Tests: No results found for this basename: AST:2,ALT:2,ALKPHOS:2,BILITOT:2,PROT:2,ALBUMIN:2 in the last 72 hours No results found for this basename: LIPASE:2,AMYLASE:2 in the last 72 hours No results found for this basename: AMMONIA:2 in the last 72 hours CBC:  Basename 03/09/12 1214  WBC 5.4  NEUTROABS 3.6  HGB 14.1  HCT 44.3  MCV 84.7  PLT 195   Cardiac Enzymes:  Basename 03/10/12 0634 03/09/12 1214  CKTOTAL 694* 1210*  CKMB -- --  CKMBINDEX -- --  TROPONINI -- <0.30   BNP: No results found for this basename: PROBNP:3 in the last 72 hours D-Dimer: No results found for this basename: DDIMER:2 in the last 72 hours CBG: No results found for this basename: GLUCAP:6 in the last 72 hours Hemoglobin A1C: No results found for this basename:  HGBA1C in the last 72 hours Fasting Lipid Panel: No results found for this basename: CHOL,HDL,LDLCALC,TRIG,CHOLHDL,LDLDIRECT in the last 72 hours Thyroid Function Tests: No results found for this basename: TSH,T4TOTAL,FREET4,T3FREE,THYROIDAB in the last 72 hours Anemia Panel: No results found for this basename: VITAMINB12,FOLATE,FERRITIN,TIBC,IRON,RETICCTPCT in the last 72 hours Coagulation: No results found for this basename: LABPROT:2,INR:2 in the last 72 hours Urine Drug Screen: Drugs of Abuse  No results found for this basename: labopia, cocainscrnur, labbenz, amphetmu, thcu, labbarb    Alcohol Level: No results found for this basename: ETH:2 in the last 72 hours Urinalysis:  Basename 03/09/12 1215  COLORURINE YELLOW  LABSPEC >1.030*  PHURINE 5.5  GLUCOSEU NEGATIVE  HGBUR NEGATIVE  BILIRUBINUR SMALL*  KETONESUR NEGATIVE  PROTEINUR 30*  UROBILINOGEN 0.2  NITRITE NEGATIVE  LEUKOCYTESUR NEGATIVE   Misc. Labs:  ABGS No results found for this basename: PHART,PCO2,PO2ART,TCO2,HCO3 in the last 72 hours CULTURES No results found for this or any previous visit (from the past 240 hour(s)). Studies/Results: Dg Chest 1 View  03/09/2012  *RADIOLOGY REPORT*  Clinical Data: Fall  CHEST - 1 VIEW  Comparison: None  Findings: Enlargement of cardiac silhouette. Rotated to the left. Atherosclerotic calcification aorta. Elevation of left diaphragm with associated left basilar atelectasis. Minimal atelectasis at right base. Upper lungs clear. No pleural effusion or pneumothorax. Bones diffusely demineralized.  IMPRESSION: Elevation of left diaphragm with bibasilar atelectasis. Enlargement of cardiac silhouette.   Original Report Authenticated By: Ulyses Southward,  M.D.    Dg Pelvis 1-2 Views  03/09/2012  *RADIOLOGY REPORT*  Clinical Data: Fall  PELVIS - 1-2 VIEW  Comparison: 03/25/2010  Findings: Hardware in the left femoral neck for femoral neck fracture fixation is stable.  No acute fracture.   Deformity is noted.  No breakage of the hardware.  Osteopenia.  Right femoral neck is intact.  Vascular calcifications are noted.  Degenerative changes and scoliosis of the lumbar spine.  IMPRESSION: No acute bony pathology.  Postoperative changes.   Original Report Authenticated By: Jolaine Click, M.D.    Ct Head Wo Contrast  03/09/2012  *RADIOLOGY REPORT*  Clinical Data: Fall a few days ago.  Altered level of consciousness.  CT HEAD WITHOUT CONTRAST  Technique:  Contiguous axial images were obtained from the base of the skull through the vertex without contrast.  Comparison: 01/26/2005.  Findings: Motion degraded exam.  No skull fracture or intracranial hemorrhage.  Remote right frontal craniotomy.  Large area of encephalomalacia right hemisphere with dilated ventricles similar to the prior examination.  No intracranial mass lesion detected on this unenhanced exam.  No CT evidence of large acute infarct.  Mastoid air cells, middle ear cavities and visualized sinuses are clear.  IMPRESSION: Motion degraded exam.  No skull fracture or intracranial hemorrhage.  Remote right frontal craniotomy.  Large area of encephalomalacia right hemisphere with dilated ventricles similar to the prior examination.   Original Report Authenticated By: Lacy Duverney, M.D.     Medications:  Prior to Admission:  No prescriptions prior to admission   Scheduled:   . diltiazem  30 mg Oral Q6H  . enoxaparin (LOVENOX) injection  30 mg Subcutaneous Q24H   Continuous:   . sodium chloride Stopped (03/09/12 2130)  . sodium chloride 75 mL/hr at 03/10/12 0530   MVH:[id number], acetaminophen, alum & mag hydroxide-simeth, ondansetron (ZOFRAN) IV, ondansetron  Assesment:she was admitted with weakness and  With atrial fibrillation. She is better but still very weak Active Problems:  * No active hospital problems. *     Plan: continue IV fluids and other treatments    LOS: 1 day   Bertrand Vowels L 03/10/2012, 3:25 PM    she has rhabdomyolysis and her renal function is not normal. This will need to be followed closely

## 2012-03-11 LAB — BASIC METABOLIC PANEL
CO2: 25 mEq/L (ref 19–32)
Calcium: 8.6 mg/dL (ref 8.4–10.5)
Potassium: 3 mEq/L — ABNORMAL LOW (ref 3.5–5.1)
Sodium: 143 mEq/L (ref 135–145)

## 2012-03-11 LAB — CK: Total CK: 534 U/L — ABNORMAL HIGH (ref 7–177)

## 2012-03-11 MED ORDER — CLONIDINE HCL 0.1 MG PO TABS: 0.1000 mg | ORAL_TABLET | Freq: Once | ORAL | Status: DC

## 2012-03-11 MED ORDER — POTASSIUM CHLORIDE CRYS ER 10 MEQ PO TBCR
20.0000 meq | EXTENDED_RELEASE_TABLET | Freq: Two times a day (BID) | ORAL | Status: DC
  Administered 2012-03-11 – 2012-03-14 (×6): 20 meq via ORAL
  Filled 2012-03-11 (×6): qty 2

## 2012-03-11 NOTE — Progress Notes (Signed)
Dr Sudie Bailey returned call. No new orders given except to watch patient for any changes in mental status.

## 2012-03-11 NOTE — Progress Notes (Signed)
B/P 191/79, recheck 170/73,hr 81,potassium 3.0,Dr Sudie Bailey notified.Orders received,and given will continue to monitor patient.No c/o pain or discomfort noted.

## 2012-03-11 NOTE — Progress Notes (Signed)
Pt found falling(rolling) out of bed. Pt leans to right side. Someone set up dinner tray on right side and left right side rail down. No injury to patient. STATES SHE WAS FINE. Attempted to call pts brother who was listed as next of kin but # NIS. Paged Dr Juanetta Gosling but no return call. Dr Sudie Bailey covering for Dr Juanetta Gosling. Dr Sudie Bailey Paged.

## 2012-03-11 NOTE — Progress Notes (Signed)
Subjective: She continues exceptionally weak. According to her nurse she is weaker than yesterday. She has no other new complaints.  Objective: Vital signs in last 24 hours: Temp:  [97.9 F (36.6 C)-98.9 F (37.2 C)] 97.9 F (36.6 C) (01/12 0354) Pulse Rate:  [59-82] 76  (01/12 0354) Resp:  [22] 22  (01/12 0354) BP: (141-151)/(65-88) 144/86 mmHg (01/12 0354) SpO2:  [93 %-95 %] 93 % (01/12 0354) Weight:  [47.628 kg (105 lb)] 47.628 kg (105 lb) (01/12 0354) Weight change: 4.491 kg (9 lb 14.4 oz) Last BM Date: 03/10/12  Intake/Output from previous day: 01/11 0701 - 01/12 0700 In: 360 [P.O.:360] Out: 50 [Urine:50]  PHYSICAL EXAM General appearance: alert and cooperative Resp: clear to auscultation bilaterally Cardio: irregularly irregular rhythm GI: soft, non-tender; bowel sounds normal; no masses,  no organomegaly Extremities: She has changes of cerebral palsy with left-sided atrophy  Lab Results:    Basic Metabolic Panel:  Basename 03/09/12 1214  NA 141  K 3.5  CL 103  CO2 28  GLUCOSE 108*  BUN 14  CREATININE 0.80  CALCIUM 10.1  MG --  PHOS --   Liver Function Tests: No results found for this basename: AST:2,ALT:2,ALKPHOS:2,BILITOT:2,PROT:2,ALBUMIN:2 in the last 72 hours No results found for this basename: LIPASE:2,AMYLASE:2 in the last 72 hours No results found for this basename: AMMONIA:2 in the last 72 hours CBC:  Basename 03/09/12 1214  WBC 5.4  NEUTROABS 3.6  HGB 14.1  HCT 44.3  MCV 84.7  PLT 195   Cardiac Enzymes:  Basename 03/10/12 0634 03/09/12 1214  CKTOTAL 694* 1210*  CKMB -- --  CKMBINDEX -- --  TROPONINI -- <0.30   BNP: No results found for this basename: PROBNP:3 in the last 72 hours D-Dimer: No results found for this basename: DDIMER:2 in the last 72 hours CBG: No results found for this basename: GLUCAP:6 in the last 72 hours Hemoglobin A1C: No results found for this basename: HGBA1C in the last 72 hours Fasting Lipid Panel: No  results found for this basename: CHOL,HDL,LDLCALC,TRIG,CHOLHDL,LDLDIRECT in the last 72 hours Thyroid Function Tests:  Basename 03/10/12 0634  TSH 1.143  T4TOTAL --  FREET4 --  T3FREE --  THYROIDAB --   Anemia Panel: No results found for this basename: VITAMINB12,FOLATE,FERRITIN,TIBC,IRON,RETICCTPCT in the last 72 hours Coagulation: No results found for this basename: LABPROT:2,INR:2 in the last 72 hours Urine Drug Screen: Drugs of Abuse  No results found for this basename: labopia, cocainscrnur, labbenz, amphetmu, thcu, labbarb    Alcohol Level: No results found for this basename: ETH:2 in the last 72 hours Urinalysis:  Basename 03/09/12 1215  COLORURINE YELLOW  LABSPEC >1.030*  PHURINE 5.5  GLUCOSEU NEGATIVE  HGBUR NEGATIVE  BILIRUBINUR SMALL*  KETONESUR NEGATIVE  PROTEINUR 30*  UROBILINOGEN 0.2  NITRITE NEGATIVE  LEUKOCYTESUR NEGATIVE   Misc. Labs:  ABGS No results found for this basename: PHART,PCO2,PO2ART,TCO2,HCO3 in the last 72 hours CULTURES Recent Results (from the past 240 hour(s))  URINE CULTURE     Status: Normal   Collection Time   03/09/12 12:15 PM      Component Value Range Status Comment   Specimen Description URINE, CATHETERIZED   Final    Special Requests NONE   Final    Culture  Setup Time 03/09/2012 22:18   Final    Colony Count NO GROWTH   Final    Culture NO GROWTH   Final    Report Status 03/10/2012 FINAL   Final    Studies/Results: Dg Chest 1  View  03/09/2012  *RADIOLOGY REPORT*  Clinical Data: Fall  CHEST - 1 VIEW  Comparison: None  Findings: Enlargement of cardiac silhouette. Rotated to the left. Atherosclerotic calcification aorta. Elevation of left diaphragm with associated left basilar atelectasis. Minimal atelectasis at right base. Upper lungs clear. No pleural effusion or pneumothorax. Bones diffusely demineralized.  IMPRESSION: Elevation of left diaphragm with bibasilar atelectasis. Enlargement of cardiac silhouette.   Original  Report Authenticated By: Ulyses Southward, M.D.    Dg Pelvis 1-2 Views  03/09/2012  *RADIOLOGY REPORT*  Clinical Data: Fall  PELVIS - 1-2 VIEW  Comparison: 03/25/2010  Findings: Hardware in the left femoral neck for femoral neck fracture fixation is stable.  No acute fracture.  Deformity is noted.  No breakage of the hardware.  Osteopenia.  Right femoral neck is intact.  Vascular calcifications are noted.  Degenerative changes and scoliosis of the lumbar spine.  IMPRESSION: No acute bony pathology.  Postoperative changes.   Original Report Authenticated By: Jolaine Click, M.D.    Ct Head Wo Contrast  03/09/2012  *RADIOLOGY REPORT*  Clinical Data: Fall a few days ago.  Altered level of consciousness.  CT HEAD WITHOUT CONTRAST  Technique:  Contiguous axial images were obtained from the base of the skull through the vertex without contrast.  Comparison: 01/26/2005.  Findings: Motion degraded exam.  No skull fracture or intracranial hemorrhage.  Remote right frontal craniotomy.  Large area of encephalomalacia right hemisphere with dilated ventricles similar to the prior examination.  No intracranial mass lesion detected on this unenhanced exam.  No CT evidence of large acute infarct.  Mastoid air cells, middle ear cavities and visualized sinuses are clear.  IMPRESSION: Motion degraded exam.  No skull fracture or intracranial hemorrhage.  Remote right frontal craniotomy.  Large area of encephalomalacia right hemisphere with dilated ventricles similar to the prior examination.   Original Report Authenticated By: Lacy Duverney, M.D.     Medications:  Prior to Admission:  No prescriptions prior to admission   Scheduled:   . diltiazem  30 mg Oral Q6H  . enoxaparin (LOVENOX) injection  30 mg Subcutaneous Q24H   Continuous:   . sodium chloride 75 mL/hr at 03/10/12 1951  . sodium chloride 75 mL/hr at 03/10/12 0530   YNW:[id number], acetaminophen, alum & mag hydroxide-simeth, ondansetron (ZOFRAN) IV,  ondansetron  Assesment: She has severe weakness. She has rhabdomyolysis perhaps from being on the floor for a while. She has mild renal failure from that. She has a new onset atrial fibrillation. She has a history of congestive heart failure. Her labwork is pending this morning Active Problems:  * No active hospital problems. *     Plan: Continue current treatments continue to watch her renal function and CPK. I have asked for physical therapy consultation.    LOS: 2 days   Doral Digangi L 03/11/2012, 8:11 AM

## 2012-03-11 NOTE — H&P (Signed)
NAME:  Traci Martin, Traci Martin                ACCOUNT NO.:  [id number]  MEDICAL RECORD NO.:  [id number]  LOCATION:  A326                          FACILITY:  [facility]  PHYSICIAN:  Kingsley Callander. Ouida Sills, MD       DATE OF BIRTH:  1924-07-04  DATE OF ADMISSION:  03/09/2012 DATE OF DISCHARGE:  LH                             HISTORY & PHYSICAL   CHIEF COMPLAINT:  Fall.  HISTORY OF PRESENT ILLNESS:  This patient is an 79 year old African American female patient of Dr. Juanetta Gosling who presented to the emergency room after she was found on the floor of her home med.  She had last been seen approximately 1 day prior to arrival, she had fallen and had been unable to get up.  She had scraped her left leg revealing multiple abrasions.  She was evaluated in the emergency room and found to be dehydrated and weak appearing with an elevated CK level consistent with rhabdomyolysis.  PAST MEDICAL HISTORY: 1. Cerebral palsy. 2. Status post craniotomy with encephalomalacia. 3. Hypertension. 4. Hyperlipidemia. 5. Congestive heart failure. 6. Osteoporosis. 7. Pelvic fracture. 8. Osteoarthritis. 9. Status post left hip fracture repair.  MEDICATIONS:  Unknown.  ALLERGIES:  None.  SOCIAL HISTORY:  She denies tobacco or alcohol use.  REVIEW OF SYSTEMS:  She denies syncope, chest pain, abdominal pain.  No fever, chills, or vomiting.  PHYSICAL EXAMINATION:  VITAL SIGNS:  Temperature 99.3, pulse in the low 100s and irregular, blood pressure 153/93, respirations 17, oxygen saturation 95%. General:  Dehydrated appearing elderly female. HEENT:  Oropharynx is edentulous and dry eyes reveal no scleral icterus. NECK:  Reveals, no JVD or thyromegaly. LUNGS:  Clear. HEART:  Irregularly irregular and tachycardic in the low 100 range. ABDOMEN:  Soft and nontender with no palpable organomegaly. EXTREMITIES:  She has multiple abrasions on the left leg.  She has atrophy in the left leg. NEURO:  She is able to lift her right leg, but  not her left which is her baseline.  She reports speech is slurred which is reportedly her baseline. LYMPH NODES:  No enlargement. SKIN:  Warm and dry.  LABORATORY DATA:  Total CK 1210, sodium 141, potassium 3.5, bicarb 28, BUN 14, creatinine 0.8, glucose 108, calcium 10.1, white count 5.4, hemoglobin 14.1, platelet 195,000 lactic acid 2.0.  Urinalysis reveals a specific gravity greater than 1.030 with urine protein at 30 mg/dL.  A CT scan of the head reveals no skull fracture or intracranial hemorrhage.  She has findings from a remote right frontal craniotomy with a large area of encephalomalacia in the right hemisphere with dilated ventricles present.  X-ray of the chest reveals elevation of the left hemidiaphragm with bibasilar atelectasis and enlargement of the cardiac silhouette.  X-ray of the pelvis reveal, no acute bony pathology.  She has postoperative changes with hardware in the left femoral neck.  Her EKG reveals atrial fibrillation with a rapid ventricular response of 111. She also has changes in the left ventricular hypertrophy with repolarization abnormality versus ischemia.  IMPRESSION/PLAN: 1. Dehydration fall and rhabdomyolysis.  She will be hospitalized for     observation and treatment with IV fluids.  Her CK will be  repeated     will be repeated tomorrow morning. 2. Atrial fibrillation with rapid ventricular response.  She dips into     the 90s at times from the low 100s.  She denies any prior knowledge     of any heart rhythm disturbances.  She will be treated with oral     diltiazem and TSH will be obtained.  Further evaluation per Dr.     Juanetta Gosling. 3. Cerebral palsy. 4. History of right craniotomy. 5. Hypertension. 6. Hyperlipidemia. 7. History of CHF, we will hydrate cautiously in light of this. 8. History of hip femur fracture.     Kingsley Callander. Ouida Sills, MD     ROF/MEDQ  D:  03/11/2012  T:  03/11/2012  Job:  [id number]

## 2012-03-12 DIAGNOSIS — R531 Weakness: Secondary | ICD-10-CM | POA: Diagnosis present

## 2012-03-12 DIAGNOSIS — G809 Cerebral palsy, unspecified: Secondary | ICD-10-CM | POA: Diagnosis present

## 2012-03-12 DIAGNOSIS — I4891 Unspecified atrial fibrillation: Secondary | ICD-10-CM | POA: Diagnosis present

## 2012-03-12 DIAGNOSIS — M6282 Rhabdomyolysis: Secondary | ICD-10-CM | POA: Diagnosis present

## 2012-03-12 DIAGNOSIS — E860 Dehydration: Secondary | ICD-10-CM | POA: Diagnosis present

## 2012-03-12 DIAGNOSIS — I10 Essential (primary) hypertension: Secondary | ICD-10-CM | POA: Diagnosis present

## 2012-03-12 LAB — CK: Total CK: 435 U/L — ABNORMAL HIGH (ref 7–177)

## 2012-03-12 LAB — BASIC METABOLIC PANEL
CO2: 26 mEq/L (ref 19–32)
Calcium: 9.5 mg/dL (ref 8.4–10.5)
GFR calc non Af Amer: 80 mL/min — ABNORMAL LOW (ref >90)
Potassium: 3.4 mEq/L — ABNORMAL LOW (ref 3.5–5.1)
Sodium: 144 mEq/L (ref 135–145)

## 2012-03-12 NOTE — Progress Notes (Signed)
Subjective: She states she feels a little bit better. She has no new complaints. She is still very weak.  Objective: Vital signs in last 24 hours: Temp:  [97.3 F (36.3 C)-99.4 F (37.4 C)] 98.8 F (37.1 C) (01/13 0430) Pulse Rate:  [71-85] 74  (01/13 0430) Resp:  [17-20] 17  (01/13 0430) BP: (113-191)/(71-96) 120/77 mmHg (01/13 0529) SpO2:  [90 %-94 %] 90 % (01/13 0430) Weight:  [45.8 kg (100 lb 15.5 oz)] 45.8 kg (100 lb 15.5 oz) (01/13 0430) Weight change: -1.828 kg (-4 lb 0.5 oz) Last BM Date: 03/10/12  Intake/Output from previous day: 01/12 0701 - 01/13 0700 In: 3482.5 [P.O.:300; I.V.:3182.5] Out: 350 [Urine:350]  PHYSICAL EXAM General appearance: alert, cooperative and Mildly confused Resp: clear to auscultation bilaterally Cardio: irregularly irregular rhythm GI: soft, non-tender; bowel sounds normal; no masses,  no organomegaly Extremities: Chronic changes from cerebral palsy  Lab Results:    Basic Metabolic Panel:  Basename 03/12/12 0546 03/11/12 0721  NA 144 143  K 3.4* 3.0*  CL 111 112  CO2 26 25  GLUCOSE 110* 97  BUN 3* 8  CREATININE 0.62 0.62  CALCIUM 9.5 8.6  MG -- --  PHOS -- --   Liver Function Tests: No results found for this basename: AST:2,ALT:2,ALKPHOS:2,BILITOT:2,PROT:2,ALBUMIN:2 in the last 72 hours No results found for this basename: LIPASE:2,AMYLASE:2 in the last 72 hours No results found for this basename: AMMONIA:2 in the last 72 hours CBC:  Basename 03/09/12 1214  WBC 5.4  NEUTROABS 3.6  HGB 14.1  HCT 44.3  MCV 84.7  PLT 195   Cardiac Enzymes:  Basename 03/12/12 0546 03/11/12 0721 03/10/12 0634 03/09/12 1214  CKTOTAL 435* 534* 694* --  CKMB -- -- -- --  CKMBINDEX -- -- -- --  TROPONINI -- -- -- <0.30   BNP: No results found for this basename: PROBNP:3 in the last 72 hours D-Dimer: No results found for this basename: DDIMER:2 in the last 72 hours CBG: No results found for this basename: GLUCAP:6 in the last 72  hours Hemoglobin A1C: No results found for this basename: HGBA1C in the last 72 hours Fasting Lipid Panel: No results found for this basename: CHOL,HDL,LDLCALC,TRIG,CHOLHDL,LDLDIRECT in the last 72 hours Thyroid Function Tests:  Basename 03/10/12 0634  TSH 1.143  T4TOTAL --  FREET4 --  T3FREE --  THYROIDAB --   Anemia Panel: No results found for this basename: VITAMINB12,FOLATE,FERRITIN,TIBC,IRON,RETICCTPCT in the last 72 hours Coagulation: No results found for this basename: LABPROT:2,INR:2 in the last 72 hours Urine Drug Screen: Drugs of Abuse  No results found for this basename: labopia, cocainscrnur, labbenz, amphetmu, thcu, labbarb    Alcohol Level: No results found for this basename: ETH:2 in the last 72 hours Urinalysis:  Basename 03/09/12 1215  COLORURINE YELLOW  LABSPEC >1.030*  PHURINE 5.5  GLUCOSEU NEGATIVE  HGBUR NEGATIVE  BILIRUBINUR SMALL*  KETONESUR NEGATIVE  PROTEINUR 30*  UROBILINOGEN 0.2  NITRITE NEGATIVE  LEUKOCYTESUR NEGATIVE   Misc. Labs:  ABGS No results found for this basename: PHART,PCO2,PO2ART,TCO2,HCO3 in the last 72 hours CULTURES Recent Results (from the past 240 hour(s))  URINE CULTURE     Status: Normal   Collection Time   03/09/12 12:15 PM      Component Value Range Status Comment   Specimen Description URINE, CATHETERIZED   Final    Special Requests NONE   Final    Culture  Setup Time 03/09/2012 22:18   Final    Colony Count NO GROWTH   Final  Culture NO GROWTH   Final    Report Status 03/10/2012 FINAL   Final    Studies/Results: No results found.  Medications:  Prior to Admission:  No prescriptions prior to admission   Scheduled:   . cloNIDine  0.1 mg Oral Once  . diltiazem  30 mg Oral Q6H  . enoxaparin (LOVENOX) injection  30 mg Subcutaneous Q24H  . potassium chloride  20 mEq Oral BID   Continuous:   . sodium chloride 75 mL/hr at 03/12/12 0200   WUJ:[id number], acetaminophen, alum & mag hydroxide-simeth,  ondansetron (ZOFRAN) IV, ondansetron  Assesment: She has severe weakness. She had rhabdomyolysis perhaps from being in the floor for an unknown period of time. She has new onset atrial fibrillation. Active Problems:  * No active hospital problems. *     Plan: For cardiology consultation for physical therapy consultation.    LOS: 3 days   Traci Martin 03/12/2012, 8:43 AM

## 2012-03-12 NOTE — Care Management Note (Signed)
    Page 1 of 1   03/14/2012     11:55:25 AM   CARE MANAGEMENT NOTE 03/14/2012  Patient:  Traci Martin, Traci Martin   Account Number:  [id number]  Date Initiated:  03/12/2012  Documentation initiated by:  Sharrie Rothman  Subjective/Objective Assessment:   Pt admitted from home with falls with rhabdomylosis. Pt lives alone but has a CAP aide. Pt confused and unable to give details about her home situation and it is questionable as to whether pt will be able to return home and make decisions.     Action/Plan:   CM and CSW attempting to contact some family. Unsure of where CAP aide is from. CM has contacted superintendent of apartment complex pt lives in.   Anticipated DC Date:  03/15/2012   Anticipated DC Plan:  SKILLED NURSING FACILITY  In-house referral  Clinical Social Worker      DC Planning Services  CM consult      Choice offered to / List presented to:             Status of service:  Completed, signed off Medicare Important Message given?  YES (If response is "NO", the following Medicare IM given date fields will be blank) Date Medicare IM given:  03/14/2012 Date Additional Medicare IM given:    Discharge Disposition:  SKILLED NURSING FACILITY  Per UR Regulation:    If discussed at Long Length of Stay Meetings, dates discussed:    Comments:  03/14/12 1155 Arlyss Queen, RN BSN CM Pt discharged to [facility] today. CSW will arrange discharge to facility.  03/12/12 1500 Arlyss Queen, RN BSN CM

## 2012-03-12 NOTE — Clinical Social Work Placement (Signed)
Clinical Social Work Department CLINICAL SOCIAL WORK PLACEMENT NOTE 03/12/2012  Patient:  Traci Martin, Traci Martin  Account Number:  [id number] Admit date:  03/09/2012  Clinical Social Worker:  Derenda Fennel, LCSW  Date/time:  03/12/2012 03:30 PM  Clinical Social Work is seeking post-discharge placement for this patient at the following level of care:   SKILLED NURSING   (*CSW will update this form in Epic as items are completed)   03/12/2012  Patient/family provided with [facility] Department of Clinical Social Work's list of facilities offering this level of care within the geographic area requested by the patient (or if unable, by the patient's family).    Patient/family informed of their freedom to choose among providers that offer the needed level of care, that participate in Medicare, Medicaid or managed care program needed by the patient, have an available bed and are willing to accept the patient.    Patient/family informed of [facility]' ownership interest in [facility], as well as of the fact that they are under no obligation to receive care at this facility.  PASARR submitted to EDS on  PASARR number received from EDS on   FL2 transmitted to all facilities in geographic area requested by pt/family on  03/12/2012 FL2 transmitted to all facilities within larger geographic area on   Patient informed that his/her managed care company has contracts with or will negotiate with  certain facilities, including the following:     Patient/family informed of bed offers received:   Patient chooses bed at  Physician recommends and patient chooses bed at    Patient to be transferred to  on   Patient to be transferred to facility by   The following physician request were entered in Epic:   Additional Comments: existing pasarr number  Derenda Fennel, LCSW [phone]

## 2012-03-12 NOTE — Progress Notes (Signed)
UR Chart Review Completed  

## 2012-03-12 NOTE — Plan of Care (Signed)
Problem: Phase II Progression Outcomes Goal: Progress activity as tolerated unless otherwise ordered Outcome: Progressing Patient able to assist with turning.

## 2012-03-12 NOTE — Evaluation (Addendum)
Physical Therapy Evaluation Patient Details Name: Traci Martin MRN: [id number] DOB: Nov 22, 1924 Today's Date: 03/12/2012 Time: 7829-5621 PT Time Calculation (min): 34 min  PT Assessment / Plan / Recommendation Clinical Impression  Pt was seen for eval.  Her prior living status is not very clear to me and I am not able to understand much of what the pt tells me.  It appears that she lives alone and has an aide a few hours/dau.  She functions from a w/c.  Currently she needs mod assist to transfer supine to sit to chair.  I would recommend SNF to develop independent transfers again.    PT Assessment  Patient needs continued PT services    Follow Up Recommendations  SNF    Does the patient have the potential to tolerate intense rehabilitation      Barriers to Discharge Decreased caregiver support      Equipment Recommendations  None recommended by PT    Recommendations for Other Services     Frequency Min 3X/week    Precautions / Restrictions Precautions Precautions: Fall Restrictions Weight Bearing Restrictions: No   Pertinent Vitals/Pain       Mobility  Bed Mobility Bed Mobility: Supine to Sit;Sit to Supine Supine to Sit: 3: Mod assist;HOB elevated Sit to Supine: Not Tested (comment) Transfers Transfers: Stand Pivot Transfers Stand Pivot Transfers: 3: Mod assist Details for Transfer Assistance: pt would probably do best with transfer to her own w/c Ambulation/Gait Ambulation/Gait Assistance: Not tested (comment) (non ambulatory)    Shoulder Instructions     Exercises     PT Diagnosis: Generalized weakness  PT Problem List: Decreased activity tolerance;Decreased strength;Decreased mobility PT Treatment Interventions: Functional mobility training;Therapeutic activities   PT Goals  Acute Rehab PT Goals PT Goal Formulation: With patient Time For Goal Achievement: 03/20/12 Potential to Achieve Goals: Fair Pt will Transfer Bed to Chair/Chair to Bed: with min  assist PT Transfer Goal: Bed to Chair/Chair to Bed - Progress: Goal set today   Visit Information  Last PT Received On: 03/12/12    Subjective Data  Subjective: pt states that she doesn't feel any weaker than normal Patient Stated Goal: none stated   Prior Functioning  Home Living Lives With: Alone Available Help at Discharge: Personal care attendant (several hours/day) Home Adaptive Equipment: Wheelchair - manual Additional Comments: it is very difficult to understand pt's speech...I do not know much of the above info Prior Function Level of Independence: Needs assistance Needs Assistance: Light Housekeeping;Meal Prep (do not know about most ADLs) Able to Take Stairs?: No Driving: No Comments: I do not think pt is ambulatory...she is able to transfer to w/c independently by her report Communication Communication: Expressive difficulties (very slurred speech)    Cognition  Overall Cognitive Status: Difficult to assess Difficult to assess due to: Impaired communication Arousal/Alertness: Awake/alert Orientation Level: Appears intact for tasks assessed Behavior During Session: [facility] for tasks performed Cognition - Other Comments: pt states that her mother cooks for her??????    Extremity/Trunk Assessment Right Upper Extremity Assessment RUE ROM/Strength/Tone: Within functional levels RUE Sensation: WFL - Light Touch RUE Coordination: WFL - gross motor Left Upper Extremity Assessment LUE ROM/Strength/Tone: Deficits LUE ROM/Strength/Tone Deficits: no function of LUE---in flexor pattern Right Lower Extremity Assessment RLE ROM/Strength/Tone: Within functional levels RLE Sensation: WFL - Light Touch RLE Coordination: WFL - gross motor Left Lower Extremity Assessment LLE ROM/Strength/Tone: Deficits LLE ROM/Strength/Tone Deficits: minimal function of LLE with ankle in full equinus Trunk Assessment Trunk Assessment: Kyphotic (severe  kyphoscoliosis)   Balance Balance Balance  Assessed: Yes Static Sitting Balance Static Sitting - Balance Support: Right upper extremity supported;Feet supported Static Sitting - Level of Assistance: 5: Stand by assistance  End of Session PT - End of Session Equipment Utilized During Treatment: Gait belt Activity Tolerance: Patient tolerated treatment well Patient left: in chair;with chair alarm set;with call bell/phone within reach Nurse Communication: Mobility status  GP     Konrad Penta 03/12/2012, 1:25 PM

## 2012-03-12 NOTE — Progress Notes (Signed)
Patient has rt side subdural hematoma noted, due to her fall.Dr Sudie Bailey aware. Third attempt made to get in touch with family unsuccessful at this time.

## 2012-03-12 NOTE — Clinical Social Work Psychosocial (Signed)
Clinical Social Work Department BRIEF PSYCHOSOCIAL ASSESSMENT 03/12/2012  Patient:  Traci Martin, Traci Martin     Account Number:  [id number]     Admit date:  03/09/2012  Clinical Social Worker:  Nancie Neas  Date/Time:  03/12/2012 03:20 PM  Referred by:  RN  Date Referred:  03/12/2012 Referred for  SNF Placement   Other Referral:   Interview type:  Patient Other interview type:    PSYCHOSOCIAL DATA Living Status:  ALONE Admitted from facility:   Level of care:   Primary support name:  Jari Favre Primary support relationship to patient:  SIBLING Degree of support available:   ?    CURRENT CONCERNS Current Concerns  Post-Acute Placement   Other Concerns:    SOCIAL WORK ASSESSMENT / PLAN CSW met with pt at bedside. Pt alert and oriented to person and place, but is very difficult to understand. She lives alone and states she has a "maid" from 8-9 am every day. Jari Favre, pt's brother, is listed on chart but number does not work. PT evaluated pt today and recommendation is for SNF at d/c. CSW discussed this with pt and twice she said, "No, I'm going home." Two other times she said she would be willing to go. Unsure how much pt understands. RN said a family member visited this morning. CSW left note in pt's room requesting call from any family.   Assessment/plan status:  Psychosocial Support/Ongoing Assessment of Needs Other assessment/ plan:   Information/referral to community resources:   SNF list    PATIENT'S/FAMILY'S RESPONSE TO PLAN OF CARE: SNF list left in pt's room. CSW faxed out pt's information in anticipation of need for SNF. CSW to follow up tomorrow.        Derenda Fennel, [place] [phone]

## 2012-03-13 NOTE — Consult Note (Signed)
[facility] Traci Martin A. Gerilyn Pilgrim, MD     [email or web address]          Traci Martin is an 79 y.o. female.   ASSESSMENT/PLAN: Acute gait impairment and severe dysphagia. I suspect that the patient has had a subcortical cerebral infarct. A MRI of the brain will be obtained. An EEG will also be obtained given that she has a large encephalomalacia increases risk of seizures.  The patient is an 79 year old white female who has a known history of cerebral palsy. She has severe dysarthria and therefore the history is difficult to ascertain. History is also obtained from reading the records. It appears from what she tells me that she has baseline left-sided weakness from cerebral palsy. The patient apparently fell hitting the left knee. She also has developed severe difficulty speaking which is not her baseline. She does not miss her reported worsening weakness on the left side. No headaches are reported. She apparently did not hit her head but has been diagnosed with rhabdomyolysis. She initially was seen in the emergency room a few days ago a head CT scan of the brain showed encephalomalacia but nothing acute. She was admitted because of persistent problems and with severe dysarthria. She is not at baseline. Again, the history difficult because of severe dysarthria but there are no reports of GI symptoms, chest pain or GU symptoms.  GENERAL: She is in no acute distress. She is pleasant and attempts to cooperate with evaluation.  HEENT: She has significant left exotropia and some evidence of proptosis bilaterally especially left side. There may be some evidence of microcephaly.  ABDOMEN: soft  EXTREMITIES: There is contracture of the left upper and lower extremities. There is bruising involving the left knee.   BACK: Unremarkable.  SKIN: Normal by inspection.    MENTAL STATUS: She is awake and alert. She has severe dysarthria. From what I can tell she appears to know that she is in the  hospital.  CRANIAL NERVES: Pupils appear is bigger than the right by about an inch below the reactive to light; extra ocular movements are full, there is no significant nystagmus; visual fields limited with appears full; there appears to be slight flattening of the nasolabial fold on the left side otherwise, upper  limited and lower facial muscles are normal in strength and symmetric, tongue is midline.  MOTOR: There is a dense left spastic hemiparesis graded at 2/5. She moves her right side well with no atrophy and normal tone.  COORDINATION: No dysmetria is noted. There is no tremors.  REFLEXES: Deep tendon reflexes are symmetrical and normal. Plantar responses are flexor bilaterally.   SENSATION: Normal to light touch.    Past Medical History  Diagnosis Date  . Hypertension   . Hyperlipidemia   . Cerebral palsy   . Frequent falls   . CHF (congestive heart failure)   . Osteoporosis   . History of pelvic fracture   . Arthritis     Past Surgical History  Procedure Date  . Hip pinning     No family history on file.  Social History:  reports that she has never smoked. She has quit using smokeless tobacco. Her smokeless tobacco use included Snuff. She reports that she does not drink alcohol. Her drug history not on file.  Allergies: No Known Allergies  Medications:  Prior to Admission medications   Not on File    Scheduled Meds:   . cloNIDine  0.1 mg Oral Once  . diltiazem  30 mg Oral Q6H  . enoxaparin (LOVENOX) injection  30 mg Subcutaneous Q24H  . potassium chloride  20 mEq Oral BID   Continuous Infusions:   . sodium chloride 75 mL/hr at 03/13/12 1744   PRN Meds:.acetaminophen, acetaminophen, alum & mag hydroxide-simeth, ondansetron (ZOFRAN) IV, ondansetron   Blood pressure 185/71, pulse 89, temperature 98.7 F (37.1 C), temperature source Oral, resp. rate 19, height 4\' 9"  (1.448 m), weight 45.8 kg (100 lb 15.5 oz), SpO2 95.00%.   Results for orders placed  during the hospital encounter of 03/09/12 (from the past 48 hour(s))  CK     Status: Abnormal   Collection Time   03/12/12  5:46 AM      Component Value Range Comment   Total CK 435 (*) 7 - 177 U/L   BASIC METABOLIC PANEL     Status: Abnormal   Collection Time   03/12/12  5:46 AM      Component Value Range Comment   Sodium 144  135 - 145 mEq/L    Potassium 3.4 (*) 3.5 - 5.1 mEq/L    Chloride 111  96 - 112 mEq/L    CO2 26  19 - 32 mEq/L    Glucose, Bld 110 (*) 70 - 99 mg/dL    BUN 3 (*) 6 - 23 mg/dL    Creatinine, Ser 4.09  0.50 - 1.10 mg/dL    Calcium 9.5  8.4 - 81.1 mg/dL    GFR calc non Af Amer 80 (*) >90 mL/min    GFR calc Af Amer >90  >90 mL/min     No results found.      Traci Martin A. Gerilyn Pilgrim, M.D.  Diplomate, Biomedical engineer of Psychiatry and Neurology ( Neurology). 03/13/2012, 8:45 PM

## 2012-03-13 NOTE — Progress Notes (Signed)
Subjective: She is overall about the same. Her family says they think her speech is different than it has been. She is still very weak  Objective: Vital signs in last 24 hours: Temp:  [98.6 F (37 C)-99.1 F (37.3 C)] 98.6 F (37 C) (01/14 0636) Pulse Rate:  [78-88] 87  (01/14 0636) Resp:  [18-20] 19  (01/14 0636) BP: (122-169)/(74-88) 122/84 mmHg (01/14 0636) SpO2:  [93 %-95 %] 95 % (01/14 0636) Weight:  [45.8 kg (100 lb 15.5 oz)] 45.8 kg (100 lb 15.5 oz) (01/14 0500) Weight change: 0 kg (0 lb) Last BM Date: 03/10/12  Intake/Output from previous day: 01/13 0701 - 01/14 0700 In: 45 [P.O.:45] Out: 1 [Urine:1]  PHYSICAL EXAM General appearance: alert, mild distress and Weak Resp: clear to auscultation bilaterally Cardio: regular rate and rhythm, S1, S2 normal, no murmur, click, rub or gallop GI: soft, non-tender; bowel sounds normal; no masses,  no organomegaly Extremities: Chronic changes from cerebral palsy  Lab Results:    Basic Metabolic Panel:  Basename 03/12/12 0546 03/11/12 0721  NA 144 143  K 3.4* 3.0*  CL 111 112  CO2 26 25  GLUCOSE 110* 97  BUN 3* 8  CREATININE 0.62 0.62  CALCIUM 9.5 8.6  MG -- --  PHOS -- --   Liver Function Tests: No results found for this basename: AST:2,ALT:2,ALKPHOS:2,BILITOT:2,PROT:2,ALBUMIN:2 in the last 72 hours No results found for this basename: LIPASE:2,AMYLASE:2 in the last 72 hours No results found for this basename: AMMONIA:2 in the last 72 hours CBC: No results found for this basename: WBC:2,NEUTROABS:2,HGB:2,HCT:2,MCV:2,PLT:2 in the last 72 hours Cardiac Enzymes:  Basename 03/12/12 0546 03/11/12 0721  CKTOTAL 435* 534*  CKMB -- --  CKMBINDEX -- --  TROPONINI -- --   BNP: No results found for this basename: PROBNP:3 in the last 72 hours D-Dimer: No results found for this basename: DDIMER:2 in the last 72 hours CBG: No results found for this basename: GLUCAP:6 in the last 72 hours Hemoglobin A1C: No results  found for this basename: HGBA1C in the last 72 hours Fasting Lipid Panel: No results found for this basename: CHOL,HDL,LDLCALC,TRIG,CHOLHDL,LDLDIRECT in the last 72 hours Thyroid Function Tests: No results found for this basename: TSH,T4TOTAL,FREET4,T3FREE,THYROIDAB in the last 72 hours Anemia Panel: No results found for this basename: VITAMINB12,FOLATE,FERRITIN,TIBC,IRON,RETICCTPCT in the last 72 hours Coagulation: No results found for this basename: LABPROT:2,INR:2 in the last 72 hours Urine Drug Screen: Drugs of Abuse  No results found for this basename: labopia, cocainscrnur, labbenz, amphetmu, thcu, labbarb    Alcohol Level: No results found for this basename: ETH:2 in the last 72 hours Urinalysis: No results found for this basename: COLORURINE:2,APPERANCEUR:2,LABSPEC:2,PHURINE:2,GLUCOSEU:2,HGBUR:2,BILIRUBINUR:2,KETONESUR:2,PROTEINUR:2,UROBILINOGEN:2,NITRITE:2,LEUKOCYTESUR:2 in the last 72 hours Misc. Labs:  ABGS No results found for this basename: PHART,PCO2,PO2ART,TCO2,HCO3 in the last 72 hours CULTURES Recent Results (from the past 240 hour(s))  URINE CULTURE     Status: Normal   Collection Time   03/09/12 12:15 PM      Component Value Range Status Comment   Specimen Description URINE, CATHETERIZED   Final    Special Requests NONE   Final    Culture  Setup Time 03/09/2012 22:18   Final    Colony Count NO GROWTH   Final    Culture NO GROWTH   Final    Report Status 03/10/2012 FINAL   Final    Studies/Results: No results found.  Medications:  Prior to Admission:  No prescriptions prior to admission   Scheduled:   . cloNIDine  0.1  mg Oral Once  . diltiazem  30 mg Oral Q6H  . enoxaparin (LOVENOX) injection  30 mg Subcutaneous Q24H  . potassium chloride  20 mEq Oral BID   Continuous:   . sodium chloride 75 mL/hr at 03/12/12 1956   OZH:YQMVHQIONGEXB, acetaminophen, alum & mag hydroxide-simeth, ondansetron (ZOFRAN) IV, ondansetron  Assesment: She has continued  problems with weakness. She has converted to sinus rhythm from atrial fibrillation. She had rhabdomyolysis which may be from being on the floor. She was dehydrated which I think is better. She has cerebral palsy which is unchanged. Since her speech may be different I will see if we can get neurology consultation. It is certainly possible that she had a stroke although that did not show on her CT Principal Problem:  *Weakness Active Problems:  Rhabdomyolysis  Atrial fibrillation  Dehydration  Hypertension  Cerebral palsy    Plan: For neurology consultation. If she has a nursing home bed available this could be done as an outpatient    LOS: 4 days   Javell Blackburn L 03/13/2012, 8:35 AM

## 2012-03-13 NOTE — Clinical Social Work Placement (Signed)
Clinical Social Work Department CLINICAL SOCIAL WORK PLACEMENT NOTE 03/13/2012  Patient:  Traci Martin, Traci Martin  Account Number:  [id number] Admit date:  03/09/2012  Clinical Social Worker:  Derenda Fennel, LCS  Date/time:  03/12/2012 03:30 PM  Clinical Social Work is seeking post-discharge placement for this patient at the following level of care:   SKILLED NURSING   (*CSW will update this form in Epic as items are completed)   03/12/2012  Patient/family provided with [facility] Department of Clinical Social Work's list of facilities offering this level of care within the geographic area requested by the patient (or if unable, by the patient's family).  03/13/2012  Patient/family informed of their freedom to choose among providers that offer the needed level of care, that participate in Medicare, Medicaid or managed care program needed by the patient, have an available bed and are willing to accept the patient.  03/13/2012  Patient/family informed of [facility]' ownership interest in [facility], as well as of the fact that they are under no obligation to receive care at this facility.  PASARR submitted to EDS on  PASARR number received from EDS on   FL2 transmitted to all facilities in geographic area requested by pt/family on  03/12/2012 FL2 transmitted to all facilities within larger geographic area on   Patient informed that his/her managed care company has contracts with or will negotiate with  certain facilities, including the following:     Patient/family informed of bed offers received:  03/13/2012 Patient chooses bed at [facility] [place] Physician recommends and patient chooses bed at  [facility] [place]  Patient to be transferred to  on   Patient to be transferred to facility by   The following physician request were entered in Epic:   Additional Comments: existing pasarr number  Derenda Fennel, LCSW [phone]

## 2012-03-13 NOTE — Clinical Social Work Note (Addendum)
CSW received call from pt's niece Norberta Keens. Pt alert and oriented to time and place this morning. CSW asked pt if okay to talk to her and she would like for Burna Mortimer to make decisions. CSW explained placement process and Burna Mortimer is agreeable to SNF, preferably in [place]. She confirmed pt only has an aid for about an hour a day and was having some difficulty at home on her own. CSW will follow up with bed offers when available.  Derenda Fennel, [place] [phone]

## 2012-03-13 NOTE — Clinical Social Work Note (Signed)
CSW presented bed offers to pt's niece who accepts [facility]. Facility notified as well as MD. Potential d/c tomorrow. CSW to continue to follow.  Derenda Fennel, [place] [phone]

## 2012-03-13 NOTE — Progress Notes (Signed)
Physical Therapy Treatment Patient Details Name: LAKECHIA NAY MRN: [id number] DOB: [birth year] Today's Date: 03/13/2012 Time: 4782-9562 PT Time Calculation (min): 22 min  PT Assessment / Plan / Recommendation Comments on Treatment Session  Mod assistance with 2+ for safety with max cueing for handplacement for safe stand pivot transfers.  Pt required vc-ing for WB LLE to assist with transfer.  Therapist noted skin tear on L posterior shoulder, RN made aware,  Pt left in chair with family member present, RN informed and family memeber asked to inform nurse before leaving for pt. safety.    Follow Up Recommendations        Does the patient have the potential to tolerate intense rehabilitation     Barriers to Discharge        Equipment Recommendations       Recommendations for Other Services    Frequency     Plan      Precautions / Restrictions Precautions Precautions: Fall Restrictions Weight Bearing Restrictions: No   Pertinent Vitals/Pain     Mobility  Bed Mobility Bed Mobility: Supine to Sit Supine to Sit: 3: Mod assist;HOB elevated Transfers Transfers: Stand Pivot Transfers Stand Pivot Transfers: 3: Mod assist;With armrests (+2 assist ppl for safety, mod assistance) Ambulation/Gait Ambulation/Gait Assistance:  (non ambulatory)    Exercises     PT Diagnosis:    PT Problem List:   PT Treatment Interventions:     PT Goals Acute Rehab PT Goals PT Goal Formulation: With patient Time For Goal Achievement: 03/20/12 Potential to Achieve Goals: Fair Pt will Transfer Bed to Chair/Chair to Bed: with min assist PT Transfer Goal: Bed to Chair/Chair to Bed - Progress: Progressing toward goal  Visit Information  Last PT Received On: 03/13/12    Subjective Data  Subjective: Pt states she is ready to get out of the bed.   Cognition  Overall Cognitive Status: Difficult to assess Difficult to assess due to: Impaired communication Arousal/Alertness:  Awake/alert Orientation Level: Appears intact for tasks assessed Behavior During Session: [facility] for tasks performed    Balance     End of Session PT - End of Session Equipment Utilized During Treatment: Gait belt Activity Tolerance: Patient tolerated treatment well Patient left: in chair;with call bell/phone within reach;with chair alarm set;with family/visitor present Nurse Communication: Mobility status   GP     Juel Burrow 03/13/2012, 4:07 PM

## 2012-03-14 ENCOUNTER — Other Ambulatory Visit (HOSPITAL_COMMUNITY): Payer: Medicare Other

## 2012-03-14 ENCOUNTER — Inpatient Hospital Stay (HOSPITAL_COMMUNITY): Payer: PRIVATE HEALTH INSURANCE

## 2012-03-14 MED ORDER — GADOBENATE DIMEGLUMINE 529 MG/ML IV SOLN
9.0000 mL | Freq: Once | INTRAVENOUS | Status: AC | PRN
  Administered 2012-03-14: 9 mL via INTRAVENOUS

## 2012-03-14 MED ORDER — ASPIRIN 81 MG PO CHEW
81.0000 mg | CHEWABLE_TABLET | Freq: Once | ORAL | Status: AC
  Administered 2012-03-14: 81 mg via ORAL
  Filled 2012-03-14: qty 1

## 2012-03-14 MED ORDER — ASPIRIN EC 81 MG PO TBEC: 81.0000 mg | DELAYED_RELEASE_TABLET | Freq: Every day | ORAL | Status: AC

## 2012-03-14 NOTE — Progress Notes (Signed)
Report called to Norlene Duel LPN at [facility].

## 2012-03-14 NOTE — Progress Notes (Signed)
Pt. Taken to [facility] via EMS.

## 2012-03-14 NOTE — Discharge Summary (Addendum)
Physician Discharge Summary  Patient ID: Traci Martin MRN: [id number] DOB/AGE: 77-Mar-1926 77 y.o. Primary Care Physician:Lajoy Vanamburg L, MD Admit date: 03/09/2012 Discharge date: 03/14/2012    Discharge Diagnoses: brainstem stroke Principal Problem:  *Weakness Active Problems:  Rhabdomyolysis  Atrial fibrillation  Dehydration  Hypertension  Cerebral palsy     Medication List    Notice       You have not been prescribed any medications.         Discharged Condition: Improved    Consults: Neurology  Significant Diagnostic Studies: Dg Chest 1 View  03/09/2012  *RADIOLOGY REPORT*  Clinical Data: Fall  CHEST - 1 VIEW  Comparison: None  Findings: Enlargement of cardiac silhouette. Rotated to the left. Atherosclerotic calcification aorta. Elevation of left diaphragm with associated left basilar atelectasis. Minimal atelectasis at right base. Upper lungs clear. No pleural effusion or pneumothorax. Bones diffusely demineralized.  IMPRESSION: Elevation of left diaphragm with bibasilar atelectasis. Enlargement of cardiac silhouette.   Original Report Authenticated By: Ulyses Southward, M.D.    Dg Pelvis 1-2 Views  03/09/2012  *RADIOLOGY REPORT*  Clinical Data: Fall  PELVIS - 1-2 VIEW  Comparison: 03/25/2010  Findings: Hardware in the left femoral neck for femoral neck fracture fixation is stable.  No acute fracture.  Deformity is noted.  No breakage of the hardware.  Osteopenia.  Right femoral neck is intact.  Vascular calcifications are noted.  Degenerative changes and scoliosis of the lumbar spine.  IMPRESSION: No acute bony pathology.  Postoperative changes.   Original Report Authenticated By: Jolaine Click, M.D.    Ct Head Wo Contrast  03/09/2012  *RADIOLOGY REPORT*  Clinical Data: Fall a few days ago.  Altered level of consciousness.  CT HEAD WITHOUT CONTRAST  Technique:  Contiguous axial images were obtained from the base of the skull through the vertex without contrast.  Comparison:  01/26/2005.  Findings: Motion degraded exam.  No skull fracture or intracranial hemorrhage.  Remote right frontal craniotomy.  Large area of encephalomalacia right hemisphere with dilated ventricles similar to the prior examination.  No intracranial mass lesion detected on this unenhanced exam.  No CT evidence of large acute infarct.  Mastoid air cells, middle ear cavities and visualized sinuses are clear.  IMPRESSION: Motion degraded exam.  No skull fracture or intracranial hemorrhage.  Remote right frontal craniotomy.  Large area of encephalomalacia right hemisphere with dilated ventricles similar to the prior examination.   Original Report Authenticated By: Lacy Duverney, M.D.     Lab Results: Basic Metabolic Panel:  Basename 03/12/12 0546  NA 144  K 3.4*  CL 111  CO2 26  GLUCOSE 110*  BUN 3*  CREATININE 0.62  CALCIUM 9.5  MG --  PHOS --   Liver Function Tests: No results found for this basename: AST:2,ALT:2,ALKPHOS:2,BILITOT:2,PROT:2,ALBUMIN:2 in the last 72 hours   CBC: No results found for this basename: WBC:2,NEUTROABS:2,HGB:2,HCT:2,MCV:2,PLT:2 in the last 72 hours  Recent Results (from the past 240 hour(s))  URINE CULTURE     Status: Normal   Collection Time   03/09/12 12:15 PM      Component Value Range Status Comment   Specimen Description URINE, CATHETERIZED   Final    Special Requests NONE   Final    Culture  Setup Time 03/09/2012 22:18   Final    Colony Count NO GROWTH   Final    Culture NO GROWTH   Final    Report Status 03/10/2012 FINAL   Final      Hospital  Course: She was admitted after being found on the floor. She was brought to the emergency room. She was found to have elevated CPK level and also found to be in atrial fibrillation which is a new problem. She was very weak and was seen by PT and felt to need skilled care facility placement. She had urology consultation because it was some question as to whether she had had a stroke. Her CT brain did not show a  definite stroke but her MRI is pending. She was in and out of atrial fibrillation during her hospitalization. Because of multiple falls she is not a good candidate for anticoagulation. Her caretaker at home so that for the last 5 days that she was at home she had found her in the floor every morning. She is set for skilled care facility placement.  Discharge Exam: Blood pressure 160/69, pulse 89, temperature 98.3 F (36.8 C), temperature source Oral, resp. rate 24, height 4\' 9"  (1.448 m), weight 44.2 kg (97 lb 7.1 oz), SpO2 90.00%. She has left-sided weakness and contractures from cerebral palsy. She is awake and alert. She is currently in sinus rhythm.  Disposition: She will be transferred to skilled care facility. She will need physical therapy occupational therapy and speech therapy. She will be on a regular diet with chopped meats and thin liquids Her medications are potassium chloride 20 mEq twice a day Clonidine 0.1 mg daily Diltiazem extended release 180 mg daily Zofran 4 mg by mouth every 6 hours when necessary nausea Tylenol 650 mg by mouth or per rectum every 4 hours when necessary mild pain or fever Enteric coated aspirin 81 mg daily She will need CBC and basic metabolic profile on 03/16/2012      Discharge Orders    Future Appointments: Provider: Department: Dept Phone: Center:   03/14/2012 1:00 PM [facility] [phone] None      Follow-up Information    Follow up with [facility], [place].   Contact information:   [address] [place] [address] [phone]          Signed: Fredirick Maudlin Pager [phone]  03/14/2012, 8:54 AM

## 2012-03-14 NOTE — Clinical Social Work Placement (Signed)
Clinical Social Work Department CLINICAL SOCIAL WORK PLACEMENT NOTE 03/14/2012  Patient:  Traci Martin, Traci Martin  Account Number:  [id number] Admit date:  03/09/2012  Clinical Social Worker:  Derenda Fennel, LCSW  Date/time:  03/12/2012 03:30 PM  Clinical Social Work is seeking post-discharge placement for this patient at the following level of care:   SKILLED NURSING   (*CSW will update this form in Epic as items are completed)   03/12/2012  Patient/family provided with [facility] Department of Clinical Social Work's list of facilities offering this level of care within the geographic area requested by the patient (or if unable, by the patient's family).  03/13/2012  Patient/family informed of their freedom to choose among providers that offer the needed level of care, that participate in Medicare, Medicaid or managed care program needed by the patient, have an available bed and are willing to accept the patient.  03/13/2012  Patient/family informed of [facility]' ownership interest in [facility], as well as of the fact that they are under no obligation to receive care at this facility.  PASARR submitted to EDS on  PASARR number received from EDS on   FL2 transmitted to all facilities in geographic area requested by pt/family on  03/12/2012 FL2 transmitted to all facilities within larger geographic area on   Patient informed that his/her managed care company has contracts with or will negotiate with  certain facilities, including the following:     Patient/family informed of bed offers received:  03/13/2012 Patient chooses bed at [facility] [place] Physician recommends and patient chooses bed at  [facility] [place]  Patient to be transferred to [facility] [place] on  03/14/2012 Patient to be transferred to facility by [facility]  The following physician request were entered in Epic:   Additional Comments: existing pasarr number  Derenda Fennel, LCSW [phone]

## 2012-03-14 NOTE — Clinical Social Work Note (Addendum)
Pt d/c today to [facility]. Pt's niece aware and agreeable and has completed paperwork at [facility]. Pt to transfer via [facility]. D/C summary faxed to facility.   Derenda Fennel, [place] [phone]

## 2012-08-09 ENCOUNTER — Ambulatory Visit (HOSPITAL_COMMUNITY)
Admission: RE | Admit: 2012-08-09 | Discharge: 2012-08-09 | Disposition: A | Discharge status: Home / Self Care | Payer: PRIVATE HEALTH INSURANCE | Source: Ambulatory Visit | Attending: Pulmonary Disease | Admitting: Pulmonary Disease

## 2012-08-09 ENCOUNTER — Other Ambulatory Visit (HOSPITAL_COMMUNITY): Payer: Self-pay | Admitting: Pulmonary Disease

## 2012-08-09 DIAGNOSIS — M7989 Other specified soft tissue disorders: Secondary | ICD-10-CM

## 2012-10-28 ENCOUNTER — Emergency Department (HOSPITAL_COMMUNITY): Payer: PRIVATE HEALTH INSURANCE

## 2012-10-28 ENCOUNTER — Emergency Department (HOSPITAL_COMMUNITY)
Admission: EM | Admit: 2012-10-28 | Discharge: 2012-10-28 | Disposition: A | Discharge status: Home / Self Care | Payer: PRIVATE HEALTH INSURANCE | Attending: Emergency Medicine | Admitting: Emergency Medicine

## 2012-10-28 ENCOUNTER — Encounter (HOSPITAL_COMMUNITY): Payer: Self-pay | Admitting: *Deleted

## 2012-10-28 DIAGNOSIS — J40 Bronchitis, not specified as acute or chronic: Secondary | ICD-10-CM | POA: Diagnosis not present

## 2012-10-28 DIAGNOSIS — Z862 Personal history of diseases of the blood and blood-forming organs and certain disorders involving the immune mechanism: Secondary | ICD-10-CM | POA: Insufficient documentation

## 2012-10-28 DIAGNOSIS — Z9181 History of falling: Secondary | ICD-10-CM | POA: Insufficient documentation

## 2012-10-28 DIAGNOSIS — I509 Heart failure, unspecified: Secondary | ICD-10-CM | POA: Diagnosis not present

## 2012-10-28 DIAGNOSIS — Z8781 Personal history of (healed) traumatic fracture: Secondary | ICD-10-CM | POA: Insufficient documentation

## 2012-10-28 DIAGNOSIS — M129 Arthropathy, unspecified: Secondary | ICD-10-CM | POA: Insufficient documentation

## 2012-10-28 DIAGNOSIS — K5649 Other impaction of intestine: Secondary | ICD-10-CM | POA: Insufficient documentation

## 2012-10-28 DIAGNOSIS — I10 Essential (primary) hypertension: Secondary | ICD-10-CM | POA: Diagnosis not present

## 2012-10-28 DIAGNOSIS — Z8612 Personal history of poliomyelitis: Secondary | ICD-10-CM | POA: Insufficient documentation

## 2012-10-28 DIAGNOSIS — Z8669 Personal history of other diseases of the nervous system and sense organs: Secondary | ICD-10-CM | POA: Insufficient documentation

## 2012-10-28 DIAGNOSIS — Z8639 Personal history of other endocrine, nutritional and metabolic disease: Secondary | ICD-10-CM | POA: Insufficient documentation

## 2012-10-28 DIAGNOSIS — Z7982 Long term (current) use of aspirin: Secondary | ICD-10-CM | POA: Insufficient documentation

## 2012-10-28 DIAGNOSIS — R05 Cough: Secondary | ICD-10-CM | POA: Diagnosis present

## 2012-10-28 HISTORY — DX: Acute poliomyelitis, unspecified: A80.9

## 2012-10-28 LAB — BASIC METABOLIC PANEL
CO2: 33 mEq/L — ABNORMAL HIGH (ref 19–32)
Glucose, Bld: 123 mg/dL — ABNORMAL HIGH (ref 70–99)
Potassium: 3.7 mEq/L (ref 3.5–5.1)
Sodium: 142 mEq/L (ref 135–145)

## 2012-10-28 LAB — CBC WITH DIFFERENTIAL/PLATELET
Hemoglobin: 13.2 g/dL (ref 12.0–15.0)
Lymphocytes Relative: 41 % (ref 12–46)
Lymphs Abs: 1.7 10*3/uL (ref 0.7–4.0)
MCV: 86.4 fL (ref 78.0–100.0)
Neutrophils Relative %: 48 % (ref 43–77)
Platelets: 171 10*3/uL (ref 150–400)
RBC: 4.77 MIL/uL (ref 3.87–5.11)
WBC: 4.2 10*3/uL (ref 4.0–10.5)

## 2012-10-28 MED ORDER — ALBUTEROL SULFATE HFA 108 (90 BASE) MCG/ACT IN AERS: 1.0000 | INHALATION_SPRAY | Freq: Four times a day (QID) | RESPIRATORY_TRACT | Status: AC | PRN

## 2012-10-28 MED ORDER — PREDNISONE 20 MG PO TABS: 20.0000 mg | ORAL_TABLET | Freq: Every day | ORAL | Status: DC

## 2012-10-28 NOTE — ED Notes (Signed)
Family member states that pt has not had a BM in 2 weeks, states that pt had taken magnesium citrate as well without relief

## 2012-10-28 NOTE — ED Notes (Signed)
nad noted prior to dc. Dc instructions reviewed and explained with family. 2 scripts given. Family voiced understanding.

## 2012-10-28 NOTE — ED Notes (Signed)
Pt brought to er by family for sob, sore throat, cough, pt alert to most questions, unsure of when symptoms started, family member states that pt's aide had advised her the pt had been wheezing, sob, has been using her breathing tx with improvement in sob.

## 2012-10-28 NOTE — ED Provider Notes (Signed)
CSN: [id number]     Arrival date & time 10/28/12  1157 History  This chart was scribed for Donnetta Hutching, MD by Greggory Stallion, ED Scribe. This patient was seen in room APA14/APA14 and the patient's care was started at 1:08 PM.     Chief Complaint  Patient presents with  . Cough    The history is provided by the patient. No language interpreter was used.   Level 5 Caveat: Dementia HPI Comments: Traci Martin is a 79 y.o  female who presents to the Emergency Department complaining of wheezing with associated cough that began last night and worsened this morning. Relative reports no relief from breathing treatment that was given earlier. Her niece states that she has been given magnesium citrate and has still not had a bowel movement in 2 weeks. Pt denies abdominal pain.  PCP is Dr. Juanetta Gosling.   Past Medical History  Diagnosis Date  . Hypertension   . Hyperlipidemia   . Cerebral palsy   . Frequent falls   . CHF (congestive heart failure)   . Osteoporosis   . History of pelvic fracture   . Arthritis   . Polio    Past Surgical History  Procedure Laterality Date  . Hip pinning     No family history on file. History  Substance Use Topics  . Smoking status: Never Smoker   . Smokeless tobacco: Former Neurosurgeon    Types: Snuff  . Alcohol Use: No   OB History   Grav Para Term Preterm Abortions TAB SAB Ect Mult Living                 Review of Systems A complete 10 system review of systems was obtained and all systems are negative except as noted in the HPI and PMH.    Allergies  Review of patient's allergies indicates no known allergies.  Home Medications   Current Outpatient Rx  Name  Route  Sig  Dispense  Refill  . aspirin EC 81 MG tablet   Oral   Take 1 tablet (81 mg total) by mouth daily.   30 tablet   12    BP 137/62  Pulse 87  Temp(Src) 98.3 F (36.8 C) (Oral)  Resp 18  Ht 5\' 2"  (1.575 m)  Wt 97 lb (43.999 kg)  BMI 17.74 kg/m2  SpO2 89% Physical Exam   Nursing note and vitals reviewed. Constitutional: She is oriented to person, place, and time. She appears well-developed and well-nourished.  Frail appearance.   HENT:  Head: Normocephalic and atraumatic.  Eyes: Conjunctivae and EOM are normal. Pupils are equal, round, and reactive to light.  Neck: Normal range of motion. Neck supple.  Cardiovascular: Normal rate, regular rhythm and normal heart sounds.   Pulmonary/Chest: Effort normal and breath sounds normal. No respiratory distress. She has no wheezes. She has no rales.  Abdominal: Soft. Bowel sounds are normal. There is no tenderness.  Musculoskeletal: Normal range of motion.  Neurological: She is alert and oriented to person, place, and time.  Skin: Skin is warm and dry.  Psychiatric: She has a normal mood and affect.    ED Course  Procedures (including critical care time) DIAGNOSTIC STUDIES: Oxygen Saturation is 89% on room air, low by my interpretation.    COORDINATION OF CARE: 1:32 PM-Discussed treatment plan which includes rectal exam with pt at bedside and pt agreed to plan. Discussed with patient and family that the x-ray shows no pneumonia.   PROCEDURE:  Digital disimpaction performed by examiner.   Copious amount of brown stool removed from rectum.   Patient tolerated procedure well.   Minimal amount of blood noted in stool   Labs Review Labs Reviewed  BASIC METABOLIC PANEL - Abnormal; Notable for the following:    CO2 33 (*)    Glucose, Bld 123 (*)    GFR calc non Af Amer 61 (*)    GFR calc Af Amer 71 (*)    All other components within normal limits  CBC WITH DIFFERENTIAL   Imaging Review Dg Chest Portable 1 View  10/28/2012   *RADIOLOGY REPORT*  Clinical Data: Cough  PORTABLE CHEST - 1 VIEW  Comparison: 03/09/2012  Findings: The cardiac shadow is stable.  Elevation left hemidiaphragm is again seen.  Left basilar atelectasis is noted. No other focal abnormality is seen.  IMPRESSION: Left basilar atelectasis.   No other focal abnormality is noted.   Original Report Authenticated By: Alcide Clever, M.D.    MDM  No diagnosis found. Patient is in no respiratory distress.  Chest x-ray negative for pneumonia. Rx prednisone 20 mg daily for one week and albuterol inhaler.   Also I performed a digital disimpaction. See procedure note.   I personally performed the services described in this documentation, which was scribed in my presence. The recorded information has been reviewed and is accurate.     Donnetta Hutching, MD 10/28/12 [phone]

## 2012-12-05 ENCOUNTER — Encounter (HOSPITAL_COMMUNITY)
Admission: EM | Disposition: A | Discharge status: Home Health | Payer: Self-pay | Source: Home / Self Care | Attending: Emergency Medicine

## 2012-12-05 ENCOUNTER — Encounter (HOSPITAL_COMMUNITY): Payer: Self-pay | Admitting: Emergency Medicine

## 2012-12-05 ENCOUNTER — Emergency Department (HOSPITAL_COMMUNITY): Payer: PRIVATE HEALTH INSURANCE

## 2012-12-05 ENCOUNTER — Emergency Department (HOSPITAL_COMMUNITY)
Admission: EM | Admit: 2012-12-05 | Discharge: 2012-12-05 | Disposition: A | Discharge status: Home Health | Payer: PRIVATE HEALTH INSURANCE | Attending: Internal Medicine | Admitting: Internal Medicine

## 2012-12-05 DIAGNOSIS — G809 Cerebral palsy, unspecified: Secondary | ICD-10-CM | POA: Diagnosis not present

## 2012-12-05 DIAGNOSIS — T18108A Unspecified foreign body in esophagus causing other injury, initial encounter: Secondary | ICD-10-CM

## 2012-12-05 DIAGNOSIS — I509 Heart failure, unspecified: Secondary | ICD-10-CM | POA: Insufficient documentation

## 2012-12-05 DIAGNOSIS — Y998 Other external cause status: Secondary | ICD-10-CM | POA: Diagnosis not present

## 2012-12-05 DIAGNOSIS — B91 Sequelae of poliomyelitis: Secondary | ICD-10-CM | POA: Diagnosis not present

## 2012-12-05 DIAGNOSIS — IMO0002 Reserved for concepts with insufficient information to code with codable children: Secondary | ICD-10-CM | POA: Insufficient documentation

## 2012-12-05 DIAGNOSIS — Y92009 Unspecified place in unspecified non-institutional (private) residence as the place of occurrence of the external cause: Secondary | ICD-10-CM | POA: Insufficient documentation

## 2012-12-05 DIAGNOSIS — Z79899 Other long term (current) drug therapy: Secondary | ICD-10-CM | POA: Insufficient documentation

## 2012-12-05 DIAGNOSIS — K2960 Other gastritis without bleeding: Secondary | ICD-10-CM

## 2012-12-05 DIAGNOSIS — I10 Essential (primary) hypertension: Secondary | ICD-10-CM | POA: Insufficient documentation

## 2012-12-05 HISTORY — PX: ESOPHAGOGASTRODUODENOSCOPY: SHX5428

## 2012-12-05 LAB — COMPREHENSIVE METABOLIC PANEL
AST: 20 U/L (ref 0–37)
Albumin: 3.7 g/dL (ref 3.5–5.2)
Alkaline Phosphatase: 123 U/L — ABNORMAL HIGH (ref 39–117)
Chloride: 103 mEq/L (ref 96–112)
Potassium: 3.4 mEq/L — ABNORMAL LOW (ref 3.5–5.1)
Sodium: 143 mEq/L (ref 135–145)
Total Bilirubin: 0.6 mg/dL (ref 0.3–1.2)

## 2012-12-05 LAB — CBC WITH DIFFERENTIAL/PLATELET
Basophils Absolute: 0 10*3/uL (ref 0.0–0.1)
Basophils Relative: 0 % (ref 0–1)
MCHC: 33.5 g/dL (ref 30.0–36.0)
Neutro Abs: 22.2 10*3/uL — ABNORMAL HIGH (ref 1.7–7.7)
Neutrophils Relative %: 89 % — ABNORMAL HIGH (ref 43–77)
RDW: 14.4 % (ref 11.5–15.5)

## 2012-12-05 SURGERY — EGD (ESOPHAGOGASTRODUODENOSCOPY)
Anesthesia: Moderate Sedation

## 2012-12-05 MED ORDER — GLUCAGON HCL (RDNA) 1 MG IJ SOLR
1.0000 mg | Freq: Once | INTRAMUSCULAR | Status: AC
  Administered 2012-12-05: 1 mg via INTRAVENOUS
  Filled 2012-12-05: qty 1

## 2012-12-05 MED ORDER — PANTOPRAZOLE SODIUM 40 MG PO TBEC: 40.0000 mg | DELAYED_RELEASE_TABLET | Freq: Every day | ORAL | Status: DC

## 2012-12-05 MED ORDER — MIDAZOLAM HCL 5 MG/5ML IJ SOLN
INTRAMUSCULAR | Status: DC | PRN
  Administered 2012-12-05 (×3): 1 mg via INTRAVENOUS

## 2012-12-05 MED ORDER — STERILE WATER FOR IRRIGATION IR SOLN
Status: DC | PRN
  Administered 2012-12-05: 14:00:00

## 2012-12-05 MED ORDER — MEPERIDINE HCL 50 MG/ML IJ SOLN
INTRAMUSCULAR | Status: AC
  Filled 2012-12-05: qty 1

## 2012-12-05 MED ORDER — MIDAZOLAM HCL 5 MG/5ML IJ SOLN
INTRAMUSCULAR | Status: AC
  Filled 2012-12-05: qty 10

## 2012-12-05 MED ORDER — BUTAMBEN-TETRACAINE-BENZOCAINE 2-2-14 % EX AERO
INHALATION_SPRAY | CUTANEOUS | Status: DC | PRN
  Administered 2012-12-05: 2 via TOPICAL

## 2012-12-05 MED ORDER — SODIUM CHLORIDE 0.9 % IV SOLN: INTRAVENOUS | Status: DC

## 2012-12-05 MED ORDER — MEPERIDINE HCL 50 MG/ML IJ SOLN
INTRAMUSCULAR | Status: DC | PRN
  Administered 2012-12-05: 15 mg via INTRAVENOUS

## 2012-12-05 MED ORDER — VERAPAMIL HCL 2.5 MG/ML IV SOLN
INTRAVENOUS | Status: DC | PRN
  Administered 2012-12-05: 1.25 mg via INTRAVENOUS

## 2012-12-05 MED ORDER — VERAPAMIL HCL 2.5 MG/ML IV SOLN
INTRAVENOUS | Status: AC
  Filled 2012-12-05: qty 2

## 2012-12-05 NOTE — Op Note (Signed)
EGD PROCEDURE REPORT  PATIENT:  Traci Martin  MR#:  [id number] Birthdate:  May 24, 1924, 79 y.o., female Endoscopist:  Dr. Malissa Hippo, MD Referred By:  Dr. [name], MD Procedure Date: 12/05/2012  Procedure:   EGD with foreign body removal.  Indications:  Patient is a 58-year-old Caucasian female with multiple medical problems who has not been able to swallow liquids or solids since yesterday when she was eating chicken. He is suspected to have esophageal foreign body.            Informed Consent:  The risks, benefits, alternatives & imponderables which include, but are not limited to, bleeding, infection, perforation, drug reaction and potential missed lesion have been reviewed.  The potential for biopsy, lesion removal, esophageal dilation, etc. have also been discussed.  Questions have been answered.  All parties agreeable.  Please see history & physical in medical record for more information.  Medications:  Demerol 15 mg IV Versed 3 mg IV Verapamil 1.25 mg IV to Cetacaine spray topically for oropharyngeal anesthesia  Description of procedure:  Patient was placed in left lateral recumbent position and Pentax videoscope was passed via oropharynx and proximal esophagus that she had large food bolus. Unable to pass scope alongside it. There was not enough working room above the level of foreign body. Slims scope passed alongside the food bolus and examination completed. Findings:  Esophagus:  Food debris at proximal esophagus broken into pieces and passed distally. Circumferential ulceration noted at 15 cm from the incisors. Somewhat dilated esophageal body but no stricture or ring noted. GEJ:  36 cm Stomach:  Stomach was empty and distended very well with insufflation. Folds in the proximal stomach were atrophic. Few prepyloric erosions are noted. Angularis fundus and cardia were examined by retroflex the scope and were normal. Duodenum:  Not examined.  Therapeutic/Diagnostic  Maneuvers Performed:  Food bolus was pushed distally and esophagus cleared.  Complications:  None  Impression: Impacted foreign bodies/food debris at proximal esophagus. Food debris was broken into pieces and pushed distally. Circumferential ulceration noted at the site of bolus obstruction felt to be physical injury from food impaction. Erosive gastritis. Suspect patient has esophageal motility disorder.  Recommendations:  Mechanical soft diet. Pantoprazole 40 mg by mouth every morning. Progress report in one week.  Reizel Calzada U  12/05/2012  2:59 PM  CC: Dr. Fredirick Maudlin, MD & Dr. Bonnetta Barry ref. provider found

## 2012-12-05 NOTE — ED Provider Notes (Signed)
CSN: [id number]     Arrival date & time 12/05/12  0945 History  This chart was scribed for Traci Lennert, MD by Traci Martin, ED scribe.  This patient was seen in room APA01/APA01 and the patient's care was started at 10:02 AM.   Chief Complaint  Patient presents with  . Swallowed Foreign Body  . Emesis    Patient is a 79 y.o. female presenting with foreign body swallowed. The history is provided by a caregiver and the patient. No language interpreter was used.  Swallowed Foreign Body This is a new (choked on soup/biscuit last night, unable to swallow since that time without her food coming back up) problem. The current episode started yesterday. The problem occurs constantly. The problem has not changed since onset.Pertinent negatives include no chest pain, no abdominal pain and no headaches. She has tried nothing for the symptoms.    HPI Comments: Traci Martin is a 79 y.o. female with h/o cerebral palsy, polio, and CHF who presents to the Emergency Department complaining of persistent difficulty swallowing that began last night.  Pt's caregiver reports that she choked while eating soup and biscuit last night.  Since then she has been unable to swallow and "everytime she swallows it comes back up."  However pt states she is able to swallow her saliva.  She denies pain to her chest or any other area and states "I just can't swallow."  Caregiver denies prior h/o similar symptoms.  She denies h/o stroke.   Past Medical History  Diagnosis Date  . Hypertension   . Hyperlipidemia   . Cerebral palsy   . Frequent falls   . CHF (congestive heart failure)   . Osteoporosis   . History of pelvic fracture   . Arthritis   . Polio     Past Surgical History  Procedure Laterality Date  . Hip pinning      History reviewed. No pertinent family history.   History  Substance Use Topics  . Smoking status: Never Smoker   . Smokeless tobacco: Former Neurosurgeon    Types: Snuff  . Alcohol Use:  No    OB History   Grav Para Term Preterm Abortions TAB SAB Ect Mult Living                  Review of Systems  Constitutional: Negative for appetite change and fatigue.  HENT: Positive for trouble swallowing. Negative for congestion, ear discharge and sinus pressure.   Eyes: Negative for discharge.  Respiratory: Negative for cough.   Cardiovascular: Negative for chest pain.  Gastrointestinal: Positive for vomiting (food comes back up when attempting to swallow). Negative for abdominal pain and diarrhea.  Genitourinary: Negative for frequency and hematuria.  Musculoskeletal: Negative for back pain.  Skin: Negative for rash.  Neurological: Negative for seizures and headaches.  Psychiatric/Behavioral: Negative for hallucinations.     Allergies  Review of patient's allergies indicates no known allergies.  Home Medications   Current Outpatient Rx  Name  Route  Sig  Dispense  Refill  . albuterol (PROVENTIL HFA;VENTOLIN HFA) 108 (90 BASE) MCG/ACT inhaler   Inhalation   Inhale 1-2 puffs into the lungs every 6 (six) hours as needed for wheezing.   1 Inhaler   0   . aspirin EC 81 MG tablet   Oral   Take 1 tablet (81 mg total) by mouth daily.   30 tablet   12   . cloNIDine (CATAPRES) 0.1 MG tablet   Oral  Take 0.1 mg by mouth daily.         Marland Kitchen diltiazem (DILACOR XR) 180 MG 24 hr capsule   Oral   Take 180 mg by mouth daily.         Marland Kitchen ezetimibe (ZETIA) 10 MG tablet   Oral   Take 5 mg by mouth daily.         Marland Kitchen ipratropium-albuterol (DUONEB) 0.5-2.5 (3) MG/3ML SOLN   Nebulization   Take 3 mLs by nebulization every 4 (four) hours as needed (shortness of breath).         . potassium chloride SA (K-DUR,KLOR-CON) 20 MEQ tablet   Oral   Take 20 mEq by mouth 2 (two) times daily.         . predniSONE (DELTASONE) 20 MG tablet   Oral   Take 1 tablet (20 mg total) by mouth daily.   7 tablet   0    BP 156/69  Pulse 86  Temp(Src) 99.3 F (37.4 C) (Oral)  Resp  18  Wt 97 lb (43.999 kg)  BMI 17.74 kg/m2  SpO2 91%  Physical Exam  Nursing note and vitals reviewed. Constitutional: She is oriented to person, place, and time. She appears well-developed.  HENT:  Head: Normocephalic.  Eyes: Conjunctivae and EOM are normal. No scleral icterus.  Neck: Neck supple. No thyromegaly present.  Cardiovascular: Normal rate and regular rhythm.  Exam reveals no gallop and no friction rub.   No murmur heard. Pulmonary/Chest: Effort normal. Stridor (possible, very minimal) present. She has no wheezes. She exhibits no tenderness.  Few crackles in right lungs  Abdominal: She exhibits no distension. There is no tenderness. There is no rebound.  Musculoskeletal: She exhibits no edema.  Contractures to left arm and profound weakness in left arm and left leg  Lymphadenopathy:    She has no cervical adenopathy.  Neurological: She is oriented to person, place, and time. She exhibits normal muscle tone. Coordination normal.  Skin: No rash noted. No erythema.  Psychiatric: She has a normal mood and affect. Her behavior is normal.    ED Course  Procedures (including critical care time)  DIAGNOSTIC STUDIES: Oxygen Saturation is 91% on room air, low by my interpretation.    COORDINATION OF CARE: 10:07 AM-Discussed treatment plan which includes glucagon, CXR and labs with pt at bedside and pt agreed to plan.   10:57 AM-After administration of 1 mg glucagon IV, pt still not able to swallow water.  Labs Review Labs Reviewed  CBC WITH DIFFERENTIAL - Abnormal; Notable for the following:    WBC 25.1 (*)    Neutrophils Relative % 89 (*)    Neutro Abs 22.2 (*)    Lymphocytes Relative 5 (*)    Monocytes Absolute 1.7 (*)    All other components within normal limits  COMPREHENSIVE METABOLIC PANEL - Abnormal; Notable for the following:    Potassium 3.4 (*)    Glucose, Bld 149 (*)    Alkaline Phosphatase 123 (*)    GFR calc non Af Amer 74 (*)    GFR calc Af Amer 85 (*)     All other components within normal limits    Imaging Review Dg Chest Port 1 View  12/05/2012   CLINICAL DATA:  Shortness of breath.  EXAM: PORTABLE CHEST - 1 VIEW  COMPARISON:  10/28/2012 and 03/09/2012.  FINDINGS: Trachea is grossly midline. Heart is enlarged, stable. Thoracic aorta is calcified. Linear opacities at the lung bases appear unchanged from 03/09/2012. No  definite new airspace consolidation or pleural fluid. Left hemidiaphragm is chronically elevated.  IMPRESSION: Favor bibasilar scarring. No acute findings.   Electronically Signed   By: Leanna Battles M.D.   On: 12/05/2012 10:52    MDM  No diagnosis found. Pt has food impaction in esophagus.  GI to scope    The chart was scribed for me under my direct supervision.  I personally performed the history, physical, and medical decision making and all procedures in the evaluation of this patient.Traci Lennert, MD 12/05/12 1145

## 2012-12-05 NOTE — ED Notes (Signed)
Took pt to OR

## 2012-12-05 NOTE — ED Notes (Signed)
Pt choked on soup/biscuit last night, unable to swallow since that time, "everytime she swallows it comes back up". Stridor auscultated.

## 2012-12-05 NOTE — H&P (Signed)
Traci Martin is an 79 y.o. female.   Chief Complaint: Patient is here for EGD and possible foreign body removal. 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 Symptoms began yesterday when she was eating her meal. In ER she was given glucagon without any benefit. Chest done was negative for pneumonia. She was evaluated by doctors and meds and referred for therapeutic EGD.  Patient lives at home and she has full-time help. She is wheelchair and bed bound.   Past Medical History  Diagnosis Date  . Hypertension   . Hyperlipidemia   . Cerebral palsy   . Frequent falls   . CHF (congestive heart failure)   . Osteoporosis   . History of pelvic fracture   . Arthritis   . Polio     Past Surgical History  Procedure Laterality Date  . Hip pinning      History reviewed. No pertinent family history. Social History:  reports that she has never smoked. She has quit using smokeless tobacco. Her smokeless tobacco use included Snuff. She reports that she does not drink alcohol or use illicit drugs.  Allergies: No Known Allergies  Medications Prior to Admission  Medication Sig Dispense Refill  . albuterol (PROVENTIL HFA;VENTOLIN HFA) 108 (90 BASE) MCG/ACT inhaler Inhale 1-2 puffs into the lungs every 6 (six) hours as needed for wheezing.  1 Inhaler  0  . aspirin EC 81 MG tablet Take 1 tablet (81 mg total) by mouth daily.  30 tablet  12  . cloNIDine (CATAPRES) 0.1 MG tablet Take 0.1 mg by mouth daily.      Marland Kitchen diltiazem (DILACOR XR) 180 MG 24 hr capsule Take 180 mg by mouth daily.      Marland Kitchen ezetimibe (ZETIA) 10 MG tablet Take 5 mg by mouth daily.      Marland Kitchen ipratropium-albuterol (DUONEB) 0.5-2.5 (3) MG/3ML SOLN Take 3 mLs by nebulization every 4 (four) hours as needed (shortness of breath).      . potassium chloride SA (K-DUR,KLOR-CON) 20 MEQ tablet Take 20 mEq by  mouth 2 (two) times daily.      . predniSONE (DELTASONE) 20 MG tablet Take 1 tablet (20 mg total) by mouth daily.  7 tablet  0    Results for orders placed during the hospital encounter of 12/05/12 (from the past 48 hour(s))  CBC WITH DIFFERENTIAL     Status: Abnormal   Collection Time    12/05/12 10:28 AM      Result Value Range   WBC 25.1 (*) 4.0 - 10.5 K/uL   RBC 4.93  3.87 - 5.11 MIL/uL   Hemoglobin 14.2  12.0 - 15.0 g/dL   HCT 19.1  47.8 - 29.5 %   MCV 86.0  78.0 - 100.0 fL   MCH 28.8  26.0 - 34.0 pg   MCHC 33.5  30.0 - 36.0 g/dL   RDW 62.1  30.8 - 65.7 %   Platelets 236  150 - 400 K/uL   Neutrophils Relative % 89 (*) 43 - 77 %   Neutro Abs 22.2 (*) 1.7 - 7.7 K/uL   Lymphocytes Relative 5 (*) 12 - 46 %   Lymphs Abs 1.1  0.7 - 4.0 K/uL   Monocytes Relative 7  3 - 12 %   Monocytes Absolute 1.7 (*) 0.1 - 1.0 K/uL   Eosinophils Relative 0  0 -  5 %   Eosinophils Absolute 0.0  0.0 - 0.7 K/uL   Basophils Relative 0  0 - 1 %   Basophils Absolute 0.0  0.0 - 0.1 K/uL  COMPREHENSIVE METABOLIC PANEL     Status: Abnormal   Collection Time    12/05/12 10:28 AM      Result Value Range   Sodium 143  135 - 145 mEq/L   Potassium 3.4 (*) 3.5 - 5.1 mEq/L   Chloride 103  96 - 112 mEq/L   CO2 28  19 - 32 mEq/L   Glucose, Bld 149 (*) 70 - 99 mg/dL   BUN 16  6 - 23 mg/dL   Creatinine, Ser 1.61  0.50 - 1.10 mg/dL   Calcium 09.6  8.4 - 04.5 mg/dL   Total Protein 8.0  6.0 - 8.3 g/dL   Albumin 3.7  3.5 - 5.2 g/dL   AST 20  0 - 37 U/L   ALT 8  0 - 35 U/L   Alkaline Phosphatase 123 (*) 39 - 117 U/L   Total Bilirubin 0.6  0.3 - 1.2 mg/dL   GFR calc non Af Amer 74 (*) >90 mL/min   GFR calc Af Amer 85 (*) >90 mL/min   Comment: (NOTE)     The eGFR has been calculated using the CKD EPI equation.     This calculation has not been validated in all clinical situations.     eGFR's persistently <90 mL/min signify possible Chronic Kidney     Disease.   Dg Chest Port 1 View  12/05/2012   CLINICAL  DATA:  Shortness of breath.  EXAM: PORTABLE CHEST - 1 VIEW  COMPARISON:  10/28/2012 and 03/09/2012.  FINDINGS: Trachea is grossly midline. Heart is enlarged, stable. Thoracic aorta is calcified. Linear opacities at the lung bases appear unchanged from 03/09/2012. No definite new airspace consolidation or pleural fluid. Left hemidiaphragm is chronically elevated.  IMPRESSION: Favor bibasilar scarring. No acute findings.   Electronically Signed   By: Leanna Battles M.D.   On: 12/05/2012 10:52    ROS  Blood pressure 142/64, pulse 76, temperature 98 F (36.7 C), temperature source Oral, resp. rate 19, weight 97 lb (43.999 kg), SpO2 90.00%. Physical Exam  Constitutional:  Thin American female in NAD  HENT:  Mouth/Throat: Oropharynx is clear and moist.  Eyes: Conjunctivae are normal.  Neck: No thyromegaly present.  Cardiovascular:  Irregular rhythm and normal S1 and S2. No murmur or gallop noted  Respiratory: Effort normal and breath sounds normal.  GI: Soft. She exhibits no distension and no mass. There is no tenderness.  Musculoskeletal: She exhibits no edema.  Contracture at left elbow with muscle atrophy.  Lymphadenopathy:    She has no cervical adenopathy.  Neurological: She is alert.  Skin: Skin is warm and dry.     Assessment/Plan Possible esophageal foreign body. EGD therapeutic intervention. History of brainstem infarct in January 2014.  Keone Kamer U 12/05/2012, 2:23 PM

## 2012-12-05 NOTE — Progress Notes (Signed)
Reported to tracy green rn  

## 2012-12-11 ENCOUNTER — Encounter (HOSPITAL_COMMUNITY): Payer: Self-pay | Admitting: Internal Medicine

## 2013-05-31 ENCOUNTER — Other Ambulatory Visit (INDEPENDENT_AMBULATORY_CARE_PROVIDER_SITE_OTHER): Payer: Self-pay | Admitting: Internal Medicine

## 2013-06-03 NOTE — Telephone Encounter (Signed)
Per Dr.Rehman may fill and give 5 refills

## 2013-08-26 ENCOUNTER — Emergency Department (HOSPITAL_COMMUNITY)
Admission: EM | Admit: 2013-08-26 | Discharge: 2013-08-26 | Disposition: A | Discharge status: Home / Self Care | Payer: PRIVATE HEALTH INSURANCE | Attending: Emergency Medicine | Admitting: Emergency Medicine

## 2013-08-26 ENCOUNTER — Emergency Department (HOSPITAL_COMMUNITY): Payer: PRIVATE HEALTH INSURANCE

## 2013-08-26 ENCOUNTER — Encounter (HOSPITAL_COMMUNITY): Payer: Self-pay | Admitting: Emergency Medicine

## 2013-08-26 DIAGNOSIS — E785 Hyperlipidemia, unspecified: Secondary | ICD-10-CM | POA: Diagnosis not present

## 2013-08-26 DIAGNOSIS — IMO0002 Reserved for concepts with insufficient information to code with codable children: Secondary | ICD-10-CM | POA: Insufficient documentation

## 2013-08-26 DIAGNOSIS — Z8612 Personal history of poliomyelitis: Secondary | ICD-10-CM | POA: Insufficient documentation

## 2013-08-26 DIAGNOSIS — I509 Heart failure, unspecified: Secondary | ICD-10-CM | POA: Insufficient documentation

## 2013-08-26 DIAGNOSIS — Z8781 Personal history of (healed) traumatic fracture: Secondary | ICD-10-CM | POA: Insufficient documentation

## 2013-08-26 DIAGNOSIS — Z79899 Other long term (current) drug therapy: Secondary | ICD-10-CM | POA: Diagnosis not present

## 2013-08-26 DIAGNOSIS — R51 Headache: Secondary | ICD-10-CM | POA: Diagnosis present

## 2013-08-26 DIAGNOSIS — Z8669 Personal history of other diseases of the nervous system and sense organs: Secondary | ICD-10-CM | POA: Insufficient documentation

## 2013-08-26 DIAGNOSIS — M129 Arthropathy, unspecified: Secondary | ICD-10-CM | POA: Diagnosis not present

## 2013-08-26 DIAGNOSIS — Z7982 Long term (current) use of aspirin: Secondary | ICD-10-CM | POA: Insufficient documentation

## 2013-08-26 DIAGNOSIS — R519 Headache, unspecified: Secondary | ICD-10-CM

## 2013-08-26 DIAGNOSIS — I10 Essential (primary) hypertension: Secondary | ICD-10-CM | POA: Diagnosis not present

## 2013-08-26 NOTE — ED Provider Notes (Signed)
CSN: [id number]     Arrival date & time 08/26/13  1530 History   First MD Initiated Contact with Patient 08/26/13 1640     No chief complaint on file.    HPI  Patient presents with a complaint that she feels "something jumping in my head". Patient is here with her caregiver. She has a history of cerebral palsy. Lives independently with medical assistance at her Alzheimer's. She's complained of since this morning. No change in baseline level behavior. She states it is not painful. She has associated nausea vomiting fever headache confusion vision changes or other problems.  Past Medical History  Diagnosis Date  . Hypertension   . Hyperlipidemia   . Cerebral palsy   . Frequent falls   . CHF (congestive heart failure)   . Osteoporosis   . History of pelvic fracture   . Arthritis   . Polio    Past Surgical History  Procedure Laterality Date  . Hip pinning    . Esophagogastroduodenoscopy N/A 12/05/2012    Procedure: ESOPHAGOGASTRODUODENOSCOPY (EGD);  Surgeon: Malissa HippoNajeeb U Rehman, MD;  Location: [facility];  Service: Endoscopy;  Laterality: N/A;   History reviewed. No pertinent family history. History  Substance Use Topics  . Smoking status: Never Smoker   . Smokeless tobacco: Former NeurosurgeonUser    Types: Snuff  . Alcohol Use: No   OB History   Grav Para Term Preterm Abortions TAB SAB Ect Mult Living                 Review of Systems  Unable to perform ROS: Other      Allergies  Review of patient's allergies indicates no known allergies.  Home Medications   Prior to Admission medications   Medication Sig Start Date End Date Taking? Authorizing Provider  albuterol (PROVENTIL HFA;VENTOLIN HFA) 108 (90 BASE) MCG/ACT inhaler Inhale 1-2 puffs into the lungs every 6 (six) hours as needed for wheezing. 10/28/12  Yes Donnetta HutchingBrian Cook, MD  aspirin EC 81 MG tablet Take 1 tablet (81 mg total) by mouth daily. 03/14/12   Fredirick MaudlinEdward L Hawkins, MD  cloNIDine (CATAPRES) 0.1 MG tablet Take 0.1 mg by  mouth daily.    Historical Provider, MD  diltiazem (DILACOR XR) 180 MG 24 hr capsule Take 180 mg by mouth daily.    Historical Provider, MD  ezetimibe (ZETIA) 10 MG tablet Take 5 mg by mouth daily.    Historical Provider, MD  ipratropium-albuterol (DUONEB) 0.5-2.5 (3) MG/3ML SOLN Take 3 mLs by nebulization every 4 (four) hours as needed (shortness of breath).    Historical Provider, MD  pantoprazole (PROTONIX) 40 MG tablet TAKE ONE TABLET BY MOUTH DAILY.    Malissa HippoNajeeb U Rehman, MD  potassium chloride SA (K-DUR,KLOR-CON) 20 MEQ tablet Take 20 mEq by mouth 2 (two) times daily.    Historical Provider, MD  predniSONE (DELTASONE) 20 MG tablet Take 1 tablet (20 mg total) by mouth daily. 10/28/12   Donnetta HutchingBrian Cook, MD   BP 127/57  Pulse 52  Temp(Src) 97.9 F (36.6 C) (Oral)  Resp 18  Ht 5\' 2"  (1.575 m)  Wt 100 lb (45.36 kg)  BMI 18.29 kg/m2  SpO2 94% Physical Exam  Constitutional: She is oriented to person, place, and time. She appears well-developed and well-nourished. No distress.  HENT:  Head: Normocephalic.  Exam to the head, neck, and scalp.  Eyes: Conjunctivae are normal. Pupils are equal, round, and reactive to light. No scleral icterus.  Lateral deviation of the left eye.  This is normal/baseline per her caregiver.  Neck: Normal range of motion. Neck supple. No thyromegaly present.  Cardiovascular: Normal rate and regular rhythm.  Exam reveals no gallop and no friction rub.   No murmur heard. Rhythm. No carotid bruits. No PVCs or ectopy.  Pulmonary/Chest: Effort normal and breath sounds normal. No respiratory distress. She has no wheezes. She has no rales.  Abdominal: Soft. Bowel sounds are normal. She exhibits no distension. There is no tenderness. There is no rebound.  Musculoskeletal: Normal range of motion.  Neurological: She is alert and oriented to person, place, and time.  Skin: Skin is warm and dry. No rash noted.  Psychiatric: She has a normal mood and affect. Her behavior is normal.     ED Course  Procedures (including critical care time) Labs Review Labs Reviewed - No data to display  Imaging Review Ct Head Wo Contrast  08/26/2013   CLINICAL DATA:  Headache and altered mental status  EXAM: CT HEAD WITHOUT CONTRAST  TECHNIQUE: Contiguous axial images were obtained from the base of the skull through the vertex without intravenous contrast.  COMPARISON:  Brain CT March 09, 2012 and brain MRI March 14, 2012  FINDINGS: There is moderate diffuse atrophy, stable. There is evidence of a prior large infarct with ex vacuo phenomenon involving most of the right parietal lobe as well as much of the right frontal and temporal lobes, stable. There is ex vacuo phenomenon involving much of the right lateral ventricle as well as the atrium of the left lateral ventricle, stable. There is no mass, acute hemorrhage, extra-axial fluid collection, or midline shift. There is patchy small vessel disease throughout the centra semiovale bilaterally. There is a prior infarct in the left lower midbrain, stable. No acute appearing infarct is seen. There is evidence of a previous craniotomy defect in the right frontal bone, stable. Bony calvarium otherwise appears intact. The mastoid air cells are clear.  IMPRESSION: Evidence of extensive right-sided infarction extensive encephalomalacia, stable. There is moderate periventricular small vessel disease, stable. Prior infarct in the lower left midbrain is stable. There is no intracranial mass, hemorrhage, or acute appearing infarct.   Electronically Signed   By: Bretta BangWilliam  Woodruff M.D.   On: 08/26/2013 17:30     EKG Interpretation None      MDM   Final diagnoses:  Nonintractable headache, unspecified chronicity pattern, unspecified headache type    No abnormal findings noted.  This is not an emergent or life-threatening complaint. Feel she is appropriate for outpatient followup if her symptoms persist.    Rolland PorterMark James, MD 08/26/13 [phone]

## 2013-08-26 NOTE — ED Notes (Signed)
Pt brought to Er due to feeling something "jumping in her head"

## 2013-08-26 NOTE — Discharge Instructions (Signed)
Return to the emergency room with any changing symptoms.  Follow up with your primary care physician

## 2013-09-19 ENCOUNTER — Emergency Department (HOSPITAL_COMMUNITY)
Admission: EM | Admit: 2013-09-19 | Discharge: 2013-09-19 | Disposition: A | Discharge status: Home / Self Care | Payer: PRIVATE HEALTH INSURANCE | Attending: Emergency Medicine | Admitting: Emergency Medicine

## 2013-09-19 ENCOUNTER — Emergency Department (HOSPITAL_COMMUNITY): Payer: PRIVATE HEALTH INSURANCE

## 2013-09-19 ENCOUNTER — Encounter (HOSPITAL_COMMUNITY): Payer: Self-pay | Admitting: Emergency Medicine

## 2013-09-19 DIAGNOSIS — S8000XA Contusion of unspecified knee, initial encounter: Secondary | ICD-10-CM | POA: Diagnosis not present

## 2013-09-19 DIAGNOSIS — Z8781 Personal history of (healed) traumatic fracture: Secondary | ICD-10-CM | POA: Insufficient documentation

## 2013-09-19 DIAGNOSIS — M129 Arthropathy, unspecified: Secondary | ICD-10-CM | POA: Diagnosis not present

## 2013-09-19 DIAGNOSIS — S8001XA Contusion of right knee, initial encounter: Secondary | ICD-10-CM

## 2013-09-19 DIAGNOSIS — I509 Heart failure, unspecified: Secondary | ICD-10-CM | POA: Diagnosis not present

## 2013-09-19 DIAGNOSIS — Z9181 History of falling: Secondary | ICD-10-CM | POA: Insufficient documentation

## 2013-09-19 DIAGNOSIS — E785 Hyperlipidemia, unspecified: Secondary | ICD-10-CM | POA: Diagnosis not present

## 2013-09-19 DIAGNOSIS — Z8673 Personal history of transient ischemic attack (TIA), and cerebral infarction without residual deficits: Secondary | ICD-10-CM | POA: Diagnosis not present

## 2013-09-19 DIAGNOSIS — Z79899 Other long term (current) drug therapy: Secondary | ICD-10-CM | POA: Insufficient documentation

## 2013-09-19 DIAGNOSIS — S8990XA Unspecified injury of unspecified lower leg, initial encounter: Secondary | ICD-10-CM | POA: Diagnosis present

## 2013-09-19 DIAGNOSIS — IMO0002 Reserved for concepts with insufficient information to code with codable children: Secondary | ICD-10-CM | POA: Diagnosis not present

## 2013-09-19 DIAGNOSIS — Z7982 Long term (current) use of aspirin: Secondary | ICD-10-CM | POA: Insufficient documentation

## 2013-09-19 DIAGNOSIS — Z8612 Personal history of poliomyelitis: Secondary | ICD-10-CM | POA: Insufficient documentation

## 2013-09-19 DIAGNOSIS — I10 Essential (primary) hypertension: Secondary | ICD-10-CM | POA: Insufficient documentation

## 2013-09-19 DIAGNOSIS — Z8669 Personal history of other diseases of the nervous system and sense organs: Secondary | ICD-10-CM | POA: Diagnosis not present

## 2013-09-19 DIAGNOSIS — R296 Repeated falls: Secondary | ICD-10-CM | POA: Insufficient documentation

## 2013-09-19 DIAGNOSIS — S99919A Unspecified injury of unspecified ankle, initial encounter: Secondary | ICD-10-CM | POA: Diagnosis present

## 2013-09-19 DIAGNOSIS — Y9389 Activity, other specified: Secondary | ICD-10-CM | POA: Insufficient documentation

## 2013-09-19 DIAGNOSIS — Y92009 Unspecified place in unspecified non-institutional (private) residence as the place of occurrence of the external cause: Secondary | ICD-10-CM | POA: Insufficient documentation

## 2013-09-19 DIAGNOSIS — S99929A Unspecified injury of unspecified foot, initial encounter: Secondary | ICD-10-CM

## 2013-09-19 HISTORY — DX: Cerebral infarction, unspecified: I63.9

## 2013-09-19 NOTE — Discharge Instructions (Signed)
Contusion °A contusion is a deep bruise. Contusions are the result of an injury that caused bleeding under the skin. The contusion may turn blue, purple, or yellow. Minor injuries will give you a painless contusion, but more severe contusions may stay painful and swollen for a few weeks.  °CAUSES  °A contusion is usually caused by a blow, trauma, or direct force to an area of the body. °SYMPTOMS  °· Swelling and redness of the injured area. °· Bruising of the injured area. °· Tenderness and soreness of the injured area. °· Pain. °DIAGNOSIS  °The diagnosis can be made by taking a history and physical exam. An X-ray, CT scan, or MRI may be needed to determine if there were any associated injuries, such as fractures. °TREATMENT  °Specific treatment will depend on what area of the body was injured. In general, the best treatment for a contusion is resting, icing, elevating, and applying cold compresses to the injured area. Over-the-counter medicines may also be recommended for pain control. Ask your caregiver what the best treatment is for your contusion. °HOME CARE INSTRUCTIONS  °· Put ice on the injured area. °¨ Put ice in a plastic bag. °¨ Place a towel between your skin and the bag. °¨ Leave the ice on for 15-20 minutes, 3-4 times a day, or as directed by your health care provider. °· Only take over-the-counter or prescription medicines for pain, discomfort, or fever as directed by your caregiver. Your caregiver may recommend avoiding anti-inflammatory medicines (aspirin, ibuprofen, and naproxen) for 48 hours because these medicines may increase bruising. °· Rest the injured area. °· If possible, elevate the injured area to reduce swelling. °SEEK IMMEDIATE MEDICAL CARE IF:  °· You have increased bruising or swelling. °· You have pain that is getting worse. °· Your swelling or pain is not relieved with medicines. °MAKE SURE YOU:  °· Understand these instructions. °· Will watch your condition. °· Will get help right  away if you are not doing well or get worse. °Document Released: 11/24/2004 Document Revised: 02/19/2013 Document Reviewed: 12/20/2010 °Exit[employer]® Patient Information ©2015 [employer]. This information is not intended to replace advice given to you by your health care provider. Make sure you discuss any questions you have with your health care provider. ° °

## 2013-09-19 NOTE — ED Notes (Signed)
Pt fell at home about 30 minutes ago. Pt leaves alone. Complaining of right knee pain.

## 2013-09-19 NOTE — ED Notes (Signed)
Patient and patients caregiver verbalizes understanding of discharge instructions, home care, and follow up care if needed. Assisted patient to car via wheel chair. Patient taken home at this time by care giver.

## 2013-09-19 NOTE — ED Notes (Signed)
Patient via ems from home. Per EMS, patient lives at home alone with a caretaker that visits daily. Patient is mobile around her home by crawling on floor and has a wheelchair that she is able to operate. Pt sts that she fell to her right side from a crawling position. Patient is complaining of pain to her right knee. Patient is able to move affected extremity freely. + pedal pulses to right foot.

## 2013-09-19 NOTE — ED Provider Notes (Signed)
CSN: [id number]     Arrival date & time 09/19/13  2019 History   First MD Initiated Contact with Patient 09/19/13 2023    This chart was scribed for Traci Razor, MD by Marica Otter, ED Scribe. This patient was seen in room [facility] and the patient's care was started at 8:27 PM.  Chief Complaint  Patient presents with  . Fall   The history is provided by the patient. No language interpreter was used.   HPI Comments: Traci Martin is a 80 y.o. female, with medical Hx noted below, who presents to the Emergency Department complaining of a fall onset earlier this evening. Pt complains of associated right knee pain. Pt rates her pain a 5 out of 10. Pt denies any head trauma, neck pain, back pain, or any other pain. Pt denies being ambulatory since the fall due to the pain. Pt reports that at baseline she is able to walk without assistance.   Past Medical History  Diagnosis Date  . Hypertension   . Hyperlipidemia   . Cerebral palsy   . Frequent falls   . CHF (congestive heart failure)   . Osteoporosis   . History of pelvic fracture   . Arthritis   . Polio    Past Surgical History  Procedure Laterality Date  . Hip pinning    . Esophagogastroduodenoscopy N/A 12/05/2012    Procedure: ESOPHAGOGASTRODUODENOSCOPY (EGD);  Surgeon: Malissa Hippo, MD;  Location: [facility];  Service: Endoscopy;  Laterality: N/A;   No family history on file. History  Substance Use Topics  . Smoking status: Never Smoker   . Smokeless tobacco: Former Neurosurgeon    Types: Snuff  . Alcohol Use: No   OB History   Grav Para Term Preterm Abortions TAB SAB Ect Mult Living                 Review of Systems  Musculoskeletal: Negative for back pain and neck pain.       Right knee pain   All other systems reviewed and are negative.  Allergies  Review of patient's allergies indicates no known allergies.  Home Medications   Prior to Admission medications   Medication Sig Start Date End Date Taking?  Authorizing Provider  albuterol (PROVENTIL HFA;VENTOLIN HFA) 108 (90 BASE) MCG/ACT inhaler Inhale 1-2 puffs into the lungs every 6 (six) hours as needed for wheezing. 10/28/12   Donnetta Hutching, MD  aspirin EC 81 MG tablet Take 1 tablet (81 mg total) by mouth daily. 03/14/12   Fredirick Maudlin, MD  cloNIDine (CATAPRES) 0.1 MG tablet Take 0.1 mg by mouth daily.    Historical Provider, MD  diltiazem (DILACOR XR) 180 MG 24 hr capsule Take 180 mg by mouth daily.    Historical Provider, MD  ezetimibe (ZETIA) 10 MG tablet Take 5 mg by mouth daily.    Historical Provider, MD  ipratropium-albuterol (DUONEB) 0.5-2.5 (3) MG/3ML SOLN Take 3 mLs by nebulization every 4 (four) hours as needed (shortness of breath).    Historical Provider, MD  pantoprazole (PROTONIX) 40 MG tablet TAKE ONE TABLET BY MOUTH DAILY.    Malissa Hippo, MD  potassium chloride SA (K-DUR,KLOR-CON) 20 MEQ tablet Take 20 mEq by mouth 2 (two) times daily.    Historical Provider, MD  predniSONE (DELTASONE) 20 MG tablet Take 1 tablet (20 mg total) by mouth daily. 10/28/12   Donnetta Hutching, MD   Triage Vitals: BP 169/92  Pulse 101  Temp(Src) 98.1 F (36.7  C) (Oral)  Resp 20  Wt 100 lb (45.36 kg)  SpO2 92% Physical Exam  Nursing note and vitals reviewed. Constitutional: She is oriented to person, place, and time. She appears well-developed and well-nourished. No distress.  HENT:  Head: Normocephalic and atraumatic.  Eyes: EOM are normal.  Neck: Normal range of motion.  Cardiovascular: Normal rate, regular rhythm and normal heart sounds.   Pulmonary/Chest: Effort normal and breath sounds normal.  Abdominal: Soft. She exhibits no distension. There is no tenderness.  Musculoskeletal: Normal range of motion.  Contractures and atrophy of left upper and left lower extremities. L patella slightly smaller in size than the right. No bony tenderness to right knee. Able to active reflex at the right knee. Palpable DP pulses bilaterally.   Neurological:  She is alert and oriented to person, place, and time.  Skin: Skin is warm and dry.  Psychiatric: She has a normal mood and affect. Judgment normal.    ED Course  Procedures (including critical care time) DIAGNOSTIC STUDIES: Oxygen Saturation is 92% on RA, low by my interpretation.    COORDINATION OF CARE: 8:32 PM-Discussed treatment plan which includes imaging with pt at bedside and pt agreed to plan. Pt also offered meds for her pain but she declined.   Labs Review Labs Reviewed - No data to display  Imaging Review No results found.  Dg Knee Complete 4 Views Right  09/19/2013   CLINICAL DATA:  Status post fall now with right knee pain  EXAM: RIGHT KNEE - COMPLETE 4+ VIEW  COMPARISON:  New  FINDINGS: The bones are osteopenic. There is no acute fracture nor dislocation. There is minimal narrowing of all 3 joint compartments. There is no joint effusion. There is popliteal artery calcification.  IMPRESSION: There is no acute bony abnormality of the right knee.   Electronically Signed   By: David  SwazilandJordan   On: 09/19/2013 20:54    EKG Interpretation None      MDM   Final diagnoses:  Knee contusion, right, initial encounter    89yF with R knee pain after fall. Imaging neg. Consider referred knee pain from hip, but very low suspicion. No pain with ROM hips. Plan symptomatic tx.   I personally preformed the services scribed in my presence. The recorded information has been reviewed is accurate. Traci RazorStephen Chudney Scheffler, MD.    Traci RazorStephen Ariabella Brien, MD 09/25/13 1320

## 2013-09-19 NOTE — ED Notes (Signed)
Contacted patients next of kin contact for patient discharge. Patients next of kin contacted patients personal aid to pick patient up for discharge.

## 2013-12-05 ENCOUNTER — Other Ambulatory Visit (INDEPENDENT_AMBULATORY_CARE_PROVIDER_SITE_OTHER): Payer: Self-pay | Admitting: Internal Medicine

## 2013-12-12 NOTE — Telephone Encounter (Signed)
Apt has been scheduled for 12/30/13 with Terri Setzer, NP. 

## 2013-12-30 ENCOUNTER — Ambulatory Visit (INDEPENDENT_AMBULATORY_CARE_PROVIDER_SITE_OTHER): Payer: PRIVATE HEALTH INSURANCE | Admitting: Internal Medicine

## 2014-01-08 ENCOUNTER — Encounter (INDEPENDENT_AMBULATORY_CARE_PROVIDER_SITE_OTHER): Payer: Self-pay | Admitting: Internal Medicine

## 2014-01-08 ENCOUNTER — Ambulatory Visit (INDEPENDENT_AMBULATORY_CARE_PROVIDER_SITE_OTHER): Payer: PRIVATE HEALTH INSURANCE | Admitting: Internal Medicine

## 2014-01-08 VITALS — BP 108/50 | HR 56 | Temp 97.4°F | Ht 62.0 in | Wt 72.0 lb

## 2014-01-08 DIAGNOSIS — R1314 Dysphagia, pharyngoesophageal phase: Secondary | ICD-10-CM | POA: Diagnosis not present

## 2014-01-08 NOTE — Patient Instructions (Signed)
Continue the Pantoprazole (Protonix). OV as needed.

## 2014-01-08 NOTE — Progress Notes (Signed)
   Subjective:    Patient ID: Traci Martin, female    DOB: [birth year], 80 y.o.   MRN: [id number]  HPI Here today for f/u. Caregiver says patient is eating good. She has not lost any weight. In July her weight was 100. Today her weight is 72.  Suspect the weight in July was estimated. Appetite is good. W/C bound.  Foods are not lodging in her esophagus.  Patient states she is not having any trouble with liquids or solids.  12/05/2012 EGD with FB removal:  Dr. Karilyn Cotaehman:   Impression: Impacted foreign bodies/food debris at proximal esophagus. Food debris was broken into pieces and pushed distally. Circumferential ulceration noted at the site of bolus obstruction felt to be physical injury from food impaction. Erosive gastritis. Suspect patient has esophageal motility disorder.    Review of Systems Past Medical History  Diagnosis Date  . Hypertension   . Hyperlipidemia   . Cerebral palsy   . Frequent falls   . CHF (congestive heart failure)   . Osteoporosis   . History of pelvic fracture   . Arthritis   . Polio   . Stroke     Past Surgical History  Procedure Laterality Date  . Hip pinning    . Esophagogastroduodenoscopy N/A 12/05/2012    Procedure: ESOPHAGOGASTRODUODENOSCOPY (EGD);  Surgeon: Malissa HippoNajeeb U Rehman, MD;  Location: [facility];  Service: Endoscopy;  Laterality: N/A;    No Known Allergies  Current Outpatient Prescriptions on File Prior to Visit  Medication Sig Dispense Refill  . albuterol (PROVENTIL HFA;VENTOLIN HFA) 108 (90 BASE) MCG/ACT inhaler Inhale 1-2 puffs into the lungs every 6 (six) hours as needed for wheezing. 1 Inhaler 0  . aspirin EC 81 MG tablet Take 1 tablet (81 mg total) by mouth daily. 30 tablet 12  . diltiazem (DILACOR XR) 180 MG 24 hr capsule Take 180 mg by mouth daily.    Marland Kitchen. ezetimibe (ZETIA) 10 MG tablet Take 5 mg by mouth daily.    Marland Kitchen. ipratropium-albuterol (DUONEB) 0.5-2.5 (3) MG/3ML SOLN Take 3 mLs by nebulization every 4 (four) hours as needed  (shortness of breath).    . pantoprazole (PROTONIX) 40 MG tablet TAKE ONE TABLET BY MOUTH DAILY. 30 tablet 1  . potassium chloride SA (K-DUR,KLOR-CON) 20 MEQ tablet Take 20 mEq by mouth 2 (two) times daily.    . cloNIDine (CATAPRES) 0.1 MG tablet Take 0.1 mg by mouth daily.     No current facility-administered medications on file prior to visit.        Objective:   Physical Exam  Filed Vitals:   01/08/14 1040  Height: 5\' 2"  (1.575 m)  Weight: 72 lb (32.659 kg)  Examined from Wheelchair. Alert and oriented. Skin warm and dry. Oral mucosa is moist.   . Sclera anicteric, conjunctivae is pink. Thyroid not enlarged. No cervical lymphadenopathy. Lungs clear. Heart regular rate and rhythm.  Abdomen is soft. Bowel sounds are positive. No hepatomegaly. No abdominal masses felt. No tenderness.  No edema to lower extremiti        Assessment & Plan:  Solid food dysphagia. No problems. Patient states no foods are lodging.

## 2014-01-30 ENCOUNTER — Other Ambulatory Visit (INDEPENDENT_AMBULATORY_CARE_PROVIDER_SITE_OTHER): Payer: Self-pay | Admitting: Internal Medicine

## 2014-04-24 DIAGNOSIS — I10 Essential (primary) hypertension: Secondary | ICD-10-CM | POA: Diagnosis not present

## 2014-04-24 DIAGNOSIS — G939 Disorder of brain, unspecified: Secondary | ICD-10-CM | POA: Diagnosis not present

## 2014-04-24 DIAGNOSIS — I482 Chronic atrial fibrillation: Secondary | ICD-10-CM | POA: Diagnosis not present

## 2014-04-24 DIAGNOSIS — F509 Eating disorder, unspecified: Secondary | ICD-10-CM | POA: Diagnosis not present

## 2014-05-13 DIAGNOSIS — L03032 Cellulitis of left toe: Secondary | ICD-10-CM | POA: Diagnosis not present

## 2014-05-13 DIAGNOSIS — B351 Tinea unguium: Secondary | ICD-10-CM | POA: Diagnosis not present

## 2014-05-27 DIAGNOSIS — L03032 Cellulitis of left toe: Secondary | ICD-10-CM | POA: Diagnosis not present

## 2014-05-27 DIAGNOSIS — B351 Tinea unguium: Secondary | ICD-10-CM | POA: Diagnosis not present

## 2014-07-24 DIAGNOSIS — I509 Heart failure, unspecified: Secondary | ICD-10-CM | POA: Diagnosis not present

## 2014-07-24 DIAGNOSIS — I10 Essential (primary) hypertension: Secondary | ICD-10-CM | POA: Diagnosis not present

## 2014-07-24 DIAGNOSIS — I482 Chronic atrial fibrillation: Secondary | ICD-10-CM | POA: Diagnosis not present

## 2014-07-24 DIAGNOSIS — G9389 Other specified disorders of brain: Secondary | ICD-10-CM | POA: Diagnosis not present

## 2014-09-05 IMAGING — CR DG KNEE COMPLETE 4+V*R*
4 series · 4 of 4 positions shown · non-contrast
Comparison: New

CLINICAL DATA: Status post fall now with right knee pain

EXAM:
RIGHT KNEE - COMPLETE 4+ VIEW

[view not recorded (1 of 4)]
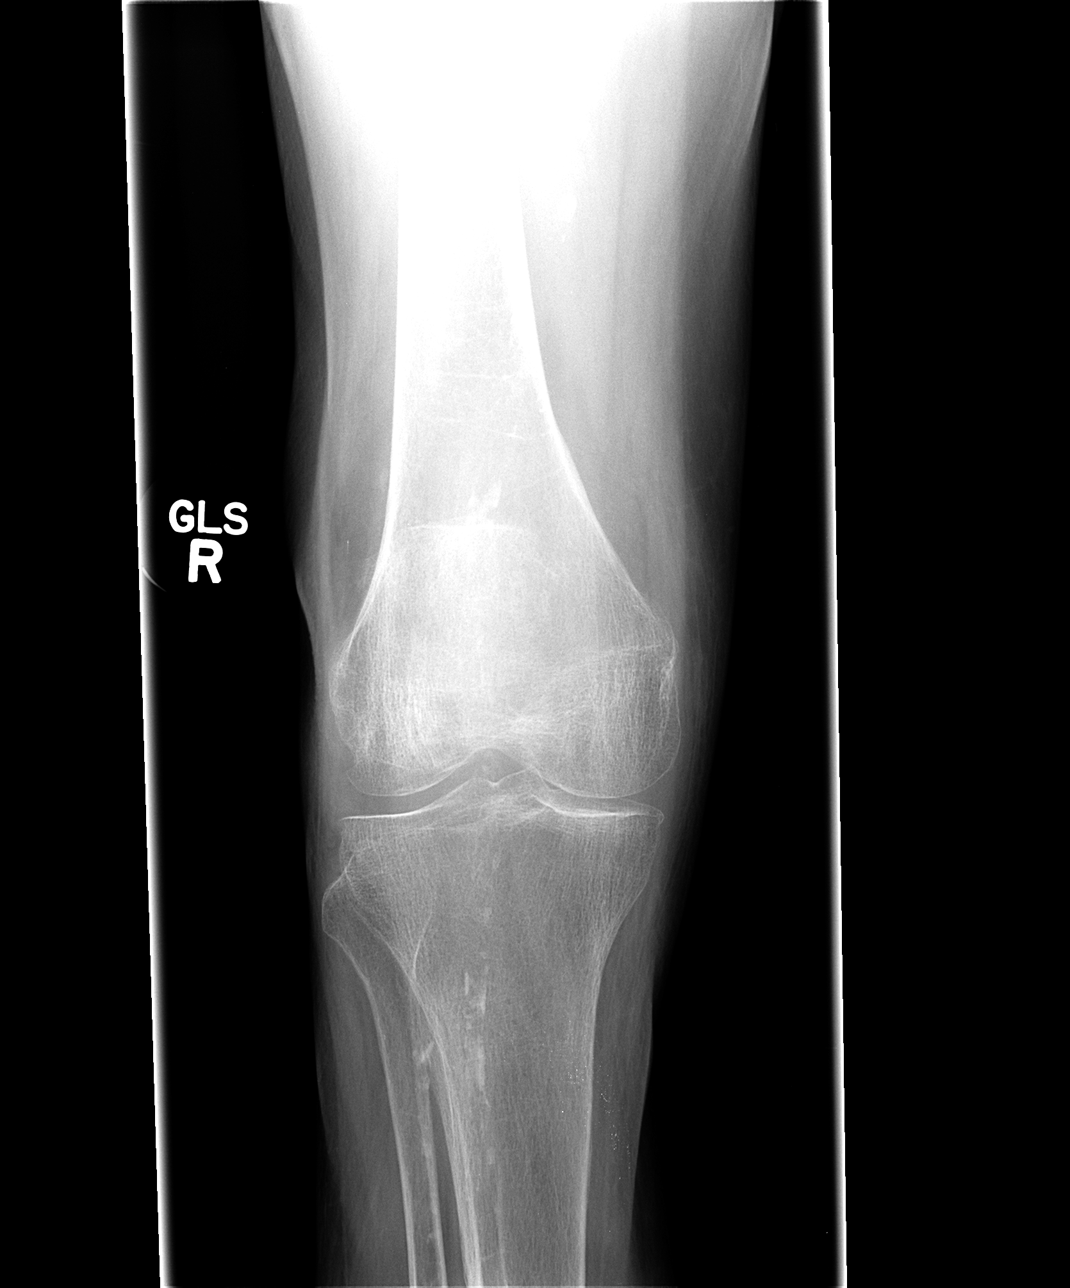

[view not recorded (2 of 4)]
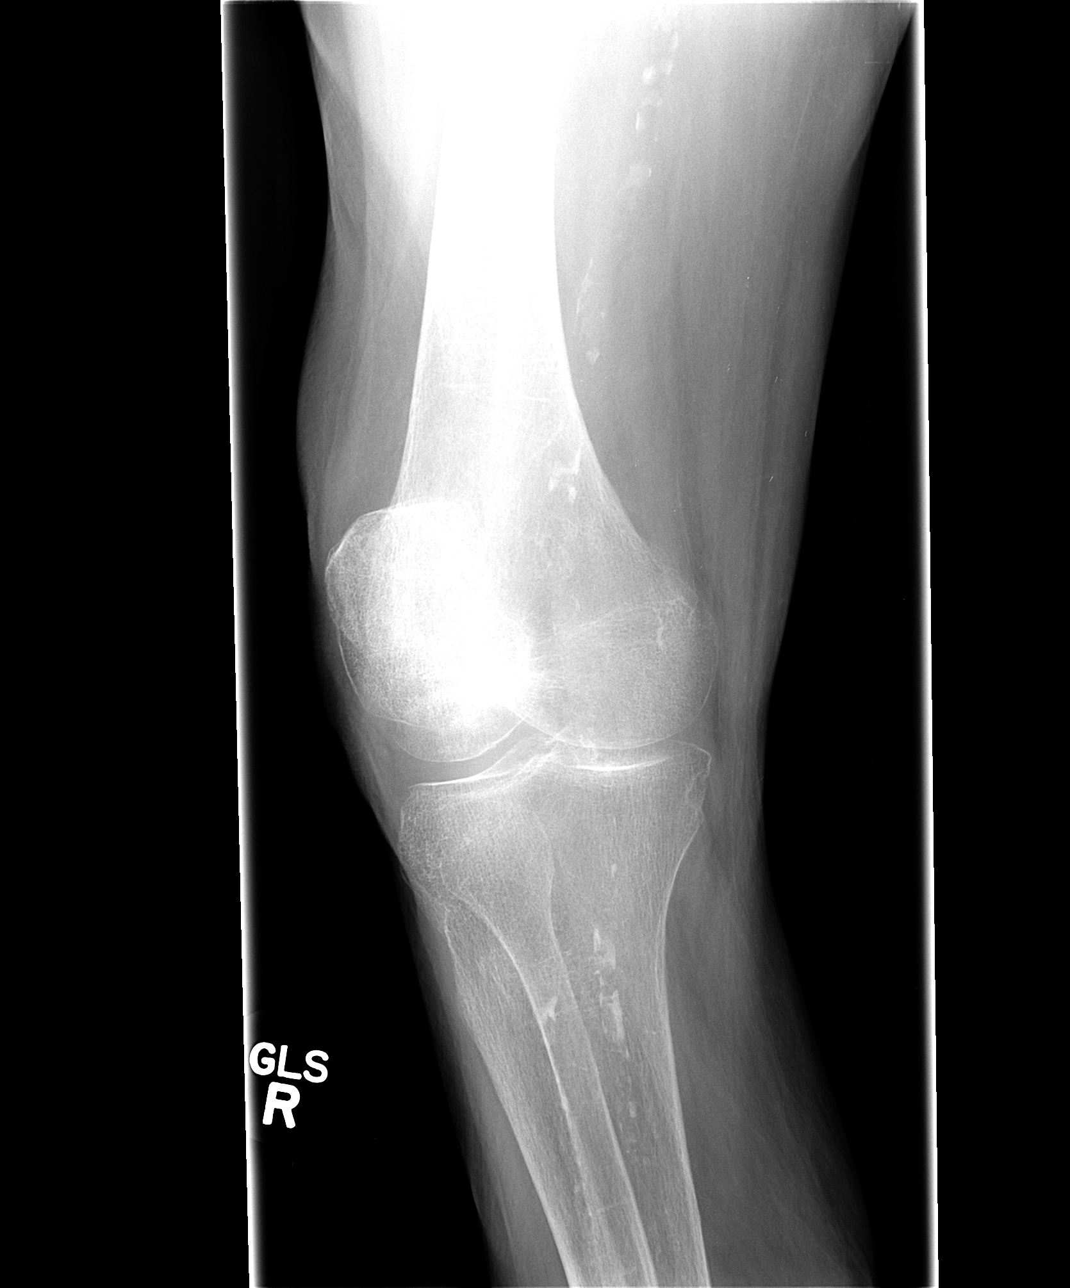

[view not recorded (3 of 4)]
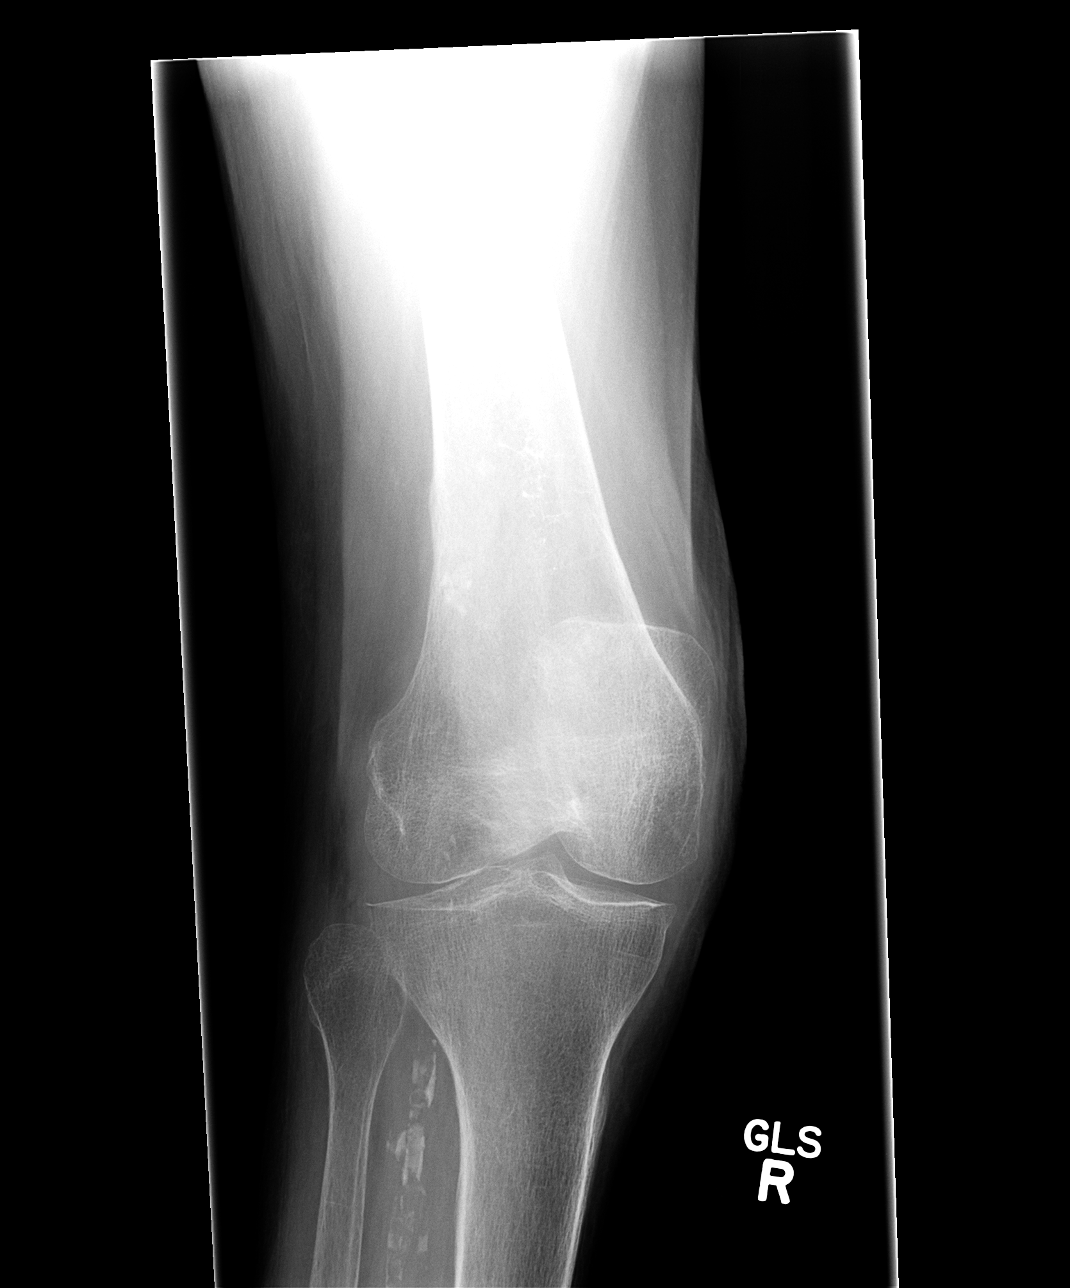

[view not recorded (4 of 4)]
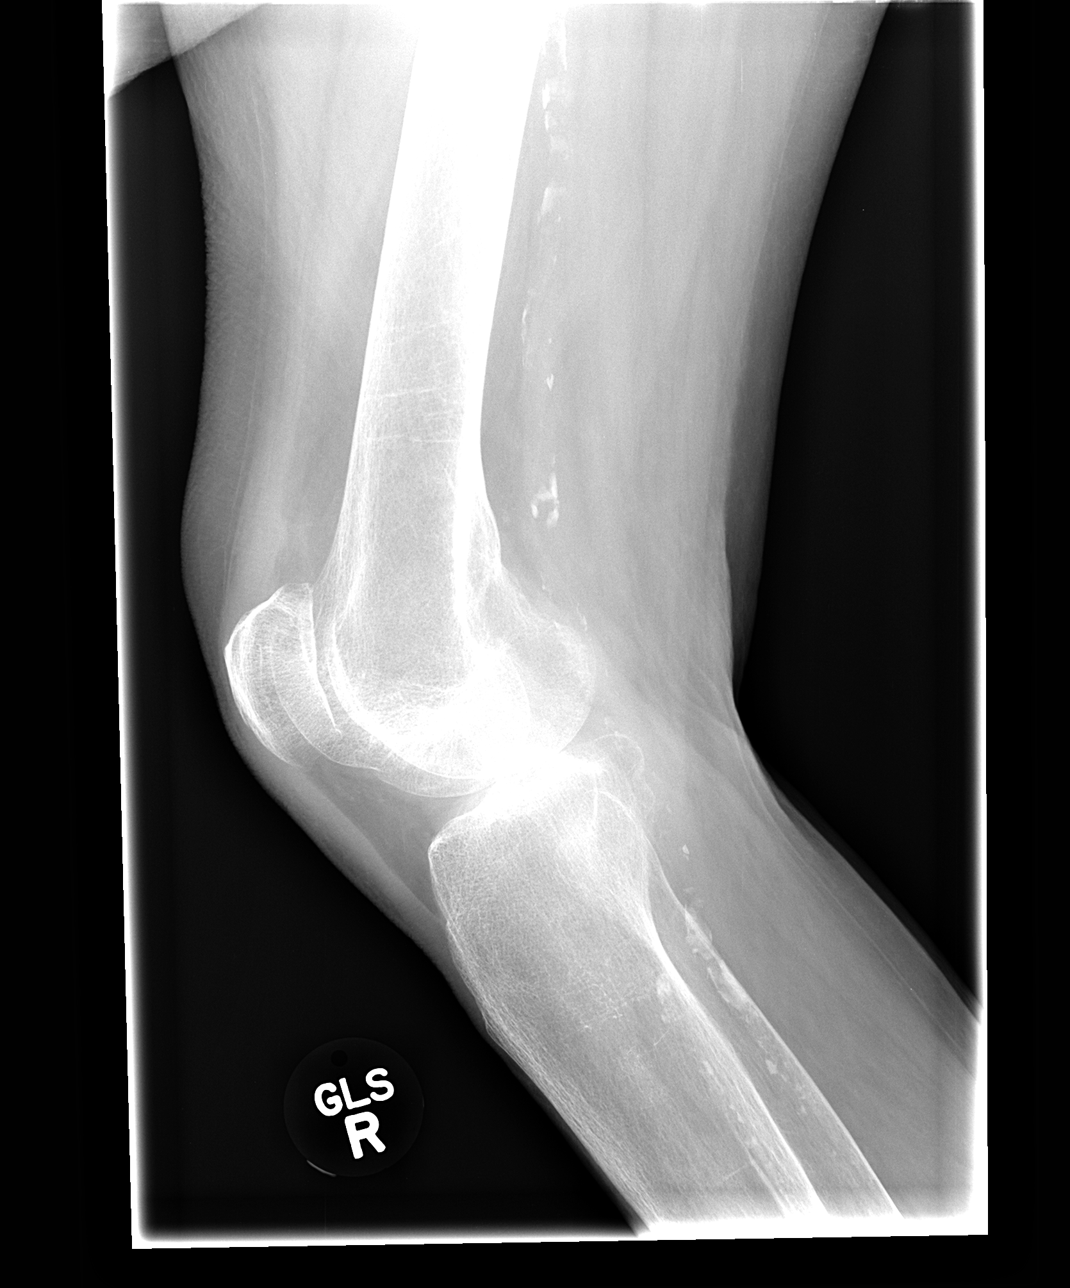

[4 of 4 positions shown; findings below may reference images not displayed]

FINDINGS: The bones are osteopenic. There is no acute fracture nor
dislocation. There is minimal narrowing of all 3 joint compartments.
There is no joint effusion. There is popliteal artery calcification.
IMPRESSION: There is no acute bony abnormality of the right knee.

## 2014-11-24 DIAGNOSIS — I482 Chronic atrial fibrillation: Secondary | ICD-10-CM | POA: Diagnosis not present

## 2014-11-24 DIAGNOSIS — I509 Heart failure, unspecified: Secondary | ICD-10-CM | POA: Diagnosis not present

## 2014-11-24 DIAGNOSIS — I10 Essential (primary) hypertension: Secondary | ICD-10-CM | POA: Diagnosis not present

## 2014-11-24 DIAGNOSIS — K219 Gastro-esophageal reflux disease without esophagitis: Secondary | ICD-10-CM | POA: Diagnosis not present

## 2014-11-24 DIAGNOSIS — Z23 Encounter for immunization: Secondary | ICD-10-CM | POA: Diagnosis not present

## 2015-01-24 ENCOUNTER — Emergency Department (HOSPITAL_COMMUNITY)
Admission: EM | Admit: 2015-01-24 | Discharge: 2015-01-24 | Disposition: A | Discharge status: Home / Self Care | Payer: Medicare Other | Attending: Emergency Medicine | Admitting: Emergency Medicine

## 2015-01-24 ENCOUNTER — Encounter (HOSPITAL_COMMUNITY): Payer: Self-pay | Admitting: *Deleted

## 2015-01-24 DIAGNOSIS — E785 Hyperlipidemia, unspecified: Secondary | ICD-10-CM | POA: Insufficient documentation

## 2015-01-24 DIAGNOSIS — Z8612 Personal history of poliomyelitis: Secondary | ICD-10-CM | POA: Insufficient documentation

## 2015-01-24 DIAGNOSIS — I10 Essential (primary) hypertension: Secondary | ICD-10-CM | POA: Insufficient documentation

## 2015-01-24 DIAGNOSIS — I509 Heart failure, unspecified: Secondary | ICD-10-CM | POA: Diagnosis not present

## 2015-01-24 DIAGNOSIS — H1131 Conjunctival hemorrhage, right eye: Secondary | ICD-10-CM | POA: Insufficient documentation

## 2015-01-24 DIAGNOSIS — Z79899 Other long term (current) drug therapy: Secondary | ICD-10-CM | POA: Insufficient documentation

## 2015-01-24 DIAGNOSIS — H578 Other specified disorders of eye and adnexa: Secondary | ICD-10-CM | POA: Diagnosis present

## 2015-01-24 DIAGNOSIS — Z7982 Long term (current) use of aspirin: Secondary | ICD-10-CM | POA: Diagnosis not present

## 2015-01-24 DIAGNOSIS — M1990 Unspecified osteoarthritis, unspecified site: Secondary | ICD-10-CM | POA: Insufficient documentation

## 2015-01-24 DIAGNOSIS — Z8673 Personal history of transient ischemic attack (TIA), and cerebral infarction without residual deficits: Secondary | ICD-10-CM | POA: Insufficient documentation

## 2015-01-24 DIAGNOSIS — Z8781 Personal history of (healed) traumatic fracture: Secondary | ICD-10-CM | POA: Insufficient documentation

## 2015-01-24 NOTE — ED Provider Notes (Signed)
CSN: [id number]     Arrival date & time 01/24/15  7829 History  By signing my name below, I, Ronney Lion, attest that this documentation has been prepared under the direction and in the presence of Doug Sou, MD. Electronically Signed: Ronney Lion, ED Scribe. 01/24/2015. 11:27 AM.    Chief Complaint  Patient presents with  . Eye Problem   The history is provided by the patient. No language interpreter was used.   HPI Comments: Traci Martin is a 81 y.o. female who presents to the Emergency Department complaining of atraumatic, constant right eye redness that patient first noticed yesterday. She states she is asymptomatic otherwise , and she denies visual disturbances or eye pain. Patient is not currently on any anticoagulants. Patient lives at home with a caretaker. She denies a history of smoking or EtOH consumption.  PCP: Fredirick Maudlin, MD   Past Medical History  Diagnosis Date  . Hypertension   . Hyperlipidemia   . Cerebral palsy (HCC)   . Frequent falls   . CHF (congestive heart failure) (HCC)   . Osteoporosis   . History of pelvic fracture   . Arthritis   . Polio   . Stroke [facility])    Past Surgical History  Procedure Laterality Date  . Hip pinning    . Esophagogastroduodenoscopy N/A 12/05/2012    Procedure: ESOPHAGOGASTRODUODENOSCOPY (EGD);  Surgeon: Malissa Hippo, MD;  Location: [facility];  Service: Endoscopy;  Laterality: N/A;   Family History  Problem Relation Age of Onset  . Family history unknown: Yes   Social History  Substance Use Topics  . Smoking status: Never Smoker   . Smokeless tobacco: Former Neurosurgeon    Types: Snuff  . Alcohol Use: No   OB History    No data available     Review of Systems  Constitutional: Negative.   Eyes: Positive for redness. Negative for pain and visual disturbance.  Respiratory: Negative.   Gastrointestinal: Negative.   Hematological: Negative.    Allergies  Review of patient's allergies indicates no known  allergies.  Home Medications   Prior to Admission medications   Medication Sig Start Date End Date Taking? Authorizing Provider  albuterol (PROVENTIL HFA;VENTOLIN HFA) 108 (90 BASE) MCG/ACT inhaler Inhale 1-2 puffs into the lungs every 6 (six) hours as needed for wheezing. 10/28/12   Donnetta Hutching, MD  aspirin EC 81 MG tablet Take 1 tablet (81 mg total) by mouth daily. 03/14/12   Kari Baars, MD  cloNIDine (CATAPRES) 0.1 MG tablet Take 0.1 mg by mouth daily.    Historical Provider, MD  diltiazem (DILACOR XR) 180 MG 24 hr capsule Take 180 mg by mouth daily.    Historical Provider, MD  ezetimibe (ZETIA) 10 MG tablet Take 5 mg by mouth daily.    Historical Provider, MD  ipratropium-albuterol (DUONEB) 0.5-2.5 (3) MG/3ML SOLN Take 3 mLs by nebulization every 4 (four) hours as needed (shortness of breath).    Historical Provider, MD  pantoprazole (PROTONIX) 40 MG tablet TAKE ONE TABLET BY MOUTH DAILY. 01/30/14   Len Blalock, NP  potassium chloride SA (K-DUR,KLOR-CON) 20 MEQ tablet Take 20 mEq by mouth 2 (two) times daily.    Historical Provider, MD   BP 156/79 mmHg  Pulse 73  Temp(Src) 97.6 F (36.4 C) (Oral)  Resp 16  SpO2 98% Physical Exam  Constitutional:  Frail-appearing, chronically ill-appearing  HENT:  Head: Normocephalic and atraumatic.  Eyes: EOM are normal. Pupils are equal, round, and reactive  to light. Left eye exhibits no discharge.  Right eye with moderatesub conjunctival hemorrhage  Neck: Neck supple. No tracheal deviation present.  Cardiovascular: Normal rate.   Pulmonary/Chest: Effort normal.  Abdominal: She exhibits no distension.  Musculoskeletal: Normal range of motion. She exhibits no edema.  Neurological: She is alert. Coordination normal.  Skin: Skin is warm and dry. No rash noted.  Psychiatric: She has a normal mood and affect.  Nursing note and vitals reviewed.   ED Course  Procedures (including critical care time)  DIAGNOSTIC STUDIES: Oxygen Saturation  is 98% on RA, normal by my interpretation.    COORDINATION OF CARE: 11:20 AM - Suspect subconjunctival hemorrhage. Will likely go away with time. Reassured pt's caretaker. Caretaker verbalized understanding. Will discharge home. F/u with PCP as needed. Pt's caretaker verbalized understanding and agreed to plan.  MDM  Plan home observation. No further treatment needed. Diagnosis subconjunctival hemorrhage of right eye Final diagnoses:  None     I personally performed the services described in this documentation, which was scribed in my presence. The recorded information has been reviewed and considered.     Doug SouSam Lindell Renfrew, MD 01/24/15 1134

## 2015-01-24 NOTE — ED Notes (Addendum)
Pts aide states that pt woke up with redness to right eye yesterday (appers to be hemorrhage) which looked larger today. Pt denies pain.

## 2015-01-24 NOTE — Discharge Instructions (Signed)
Subconjunctival Hemorrhage °Subconjunctival hemorrhage is bleeding that happens between the white part of your eye (sclera) and the clear membrane that covers the outside of your eye (conjunctiva). There are many tiny blood vessels near the surface of your eye. A subconjunctival hemorrhage happens when one or more of these vessels breaks and bleeds, causing a red patch to appear on your eye. This is similar to a bruise. °Depending on the amount of bleeding, the red patch may only cover a small area of your eye or it may cover the entire visible part of the sclera. If a lot of blood collects under the conjunctiva, there may also be swelling. Subconjunctival hemorrhages do not affect your vision or cause pain, but your eye may feel irritated if there is swelling. Subconjunctival hemorrhages usually do not require treatment, and they disappear on their own within two weeks. °CAUSES °This condition may be caused by: °· Mild trauma, such as rubbing your eye too hard. °· Severe trauma or blunt injuries. °· Coughing, sneezing, or vomiting. °· Straining, such as when lifting a heavy object. °· High blood pressure. °· Recent eye surgery. °· A history of diabetes. °· Certain medicines, especially blood thinners (anticoagulants). °· Other conditions, such as eye tumors, bleeding disorders, or blood vessel abnormalities. °Subconjunctival hemorrhages can happen without an obvious cause.  °SYMPTOMS  °Symptoms of this condition include: °· A bright red or dark red patch on the white part of the eye. °¨ The red area may spread out to cover a larger area of the eye before it goes away. °¨ The red area may turn brownish-yellow before it goes away. °· Swelling. °· Mild eye irritation. °DIAGNOSIS °This condition is diagnosed with a physical exam. If your subconjunctival hemorrhage was caused by trauma, your health care provider may refer you to an eye specialist (ophthalmologist) or another specialist to check for other injuries. You  may have other tests, including: °· An eye exam. °· A blood pressure check. °· Blood tests to check for bleeding disorders. °If your subconjunctival hemorrhage was caused by trauma, X-rays or a CT scan may be done to check for other injuries. °TREATMENT °Usually, no treatment is needed. Your health care provider may recommend eye drops or cold compresses to help with discomfort. °HOME CARE INSTRUCTIONS °· Take over-the-counter and prescription medicines only as directed by your health care provider. °· Use eye drops or cold compresses to help with discomfort as directed by your health care provider. °· Avoid activities, things, and environments that may irritate or injure your eye. °· Keep all follow-up visits as told by your health care provider. This is important. °SEEK MEDICAL CARE IF: °· You have pain in your eye. °· The bleeding does not go away within 3 weeks. °· You keep getting new subconjunctival hemorrhages. °SEEK IMMEDIATE MEDICAL CARE IF: °· Your vision changes or you have difficulty seeing. °· You suddenly develop severe sensitivity to light. °· You develop a severe headache, persistent vomiting, confusion, or abnormal tiredness (lethargy). °· Your eye seems to bulge or protrude from your eye socket. °· You develop unexplained bruises on your body. °· You have unexplained bleeding in another area of your body. °  °This information is not intended to replace advice given to you by your health care provider. Make sure you discuss any questions you have with your health care provider. °  °Document Released: 02/14/2005 Document Revised: 11/05/2014 Document Reviewed: 04/23/2014 °Elsevier Interactive Patient Education ©2016 [employer] ° °

## 2015-01-28 ENCOUNTER — Other Ambulatory Visit (INDEPENDENT_AMBULATORY_CARE_PROVIDER_SITE_OTHER): Payer: Self-pay | Admitting: Internal Medicine

## 2015-03-26 DIAGNOSIS — I10 Essential (primary) hypertension: Secondary | ICD-10-CM | POA: Diagnosis not present

## 2015-03-26 DIAGNOSIS — I509 Heart failure, unspecified: Secondary | ICD-10-CM | POA: Diagnosis not present

## 2015-03-26 DIAGNOSIS — G9389 Other specified disorders of brain: Secondary | ICD-10-CM | POA: Diagnosis not present

## 2015-03-26 DIAGNOSIS — I482 Chronic atrial fibrillation: Secondary | ICD-10-CM | POA: Diagnosis not present

## 2015-08-03 DIAGNOSIS — G9389 Other specified disorders of brain: Secondary | ICD-10-CM | POA: Diagnosis not present

## 2015-08-03 DIAGNOSIS — I10 Essential (primary) hypertension: Secondary | ICD-10-CM | POA: Diagnosis not present

## 2015-08-03 DIAGNOSIS — I509 Heart failure, unspecified: Secondary | ICD-10-CM | POA: Diagnosis not present

## 2015-08-03 DIAGNOSIS — I482 Chronic atrial fibrillation: Secondary | ICD-10-CM | POA: Diagnosis not present

## 2016-01-10 ENCOUNTER — Encounter (HOSPITAL_COMMUNITY): Payer: Self-pay | Admitting: *Deleted

## 2016-01-10 ENCOUNTER — Emergency Department (HOSPITAL_COMMUNITY): Payer: Medicare Other

## 2016-01-10 ENCOUNTER — Inpatient Hospital Stay (HOSPITAL_COMMUNITY)
Admission: EM | Admit: 2016-01-10 | Discharge: 2016-01-14 | DRG: 177 | Disposition: A | Discharge status: Home Health | Payer: Medicare Other | Attending: Pulmonary Disease | Admitting: Pulmonary Disease

## 2016-01-10 DIAGNOSIS — J189 Pneumonia, unspecified organism: Secondary | ICD-10-CM

## 2016-01-10 DIAGNOSIS — Z681 Body mass index (BMI) 19 or less, adult: Secondary | ICD-10-CM

## 2016-01-10 DIAGNOSIS — G809 Cerebral palsy, unspecified: Secondary | ICD-10-CM | POA: Diagnosis present

## 2016-01-10 DIAGNOSIS — R1314 Dysphagia, pharyngoesophageal phase: Secondary | ICD-10-CM | POA: Diagnosis not present

## 2016-01-10 DIAGNOSIS — Z7982 Long term (current) use of aspirin: Secondary | ICD-10-CM

## 2016-01-10 DIAGNOSIS — Z8612 Personal history of poliomyelitis: Secondary | ICD-10-CM

## 2016-01-10 DIAGNOSIS — G804 Ataxic cerebral palsy: Secondary | ICD-10-CM

## 2016-01-10 DIAGNOSIS — J690 Pneumonitis due to inhalation of food and vomit: Secondary | ICD-10-CM | POA: Diagnosis not present

## 2016-01-10 DIAGNOSIS — R079 Chest pain, unspecified: Secondary | ICD-10-CM

## 2016-01-10 DIAGNOSIS — R0902 Hypoxemia: Secondary | ICD-10-CM

## 2016-01-10 DIAGNOSIS — E43 Unspecified severe protein-calorie malnutrition: Secondary | ICD-10-CM | POA: Diagnosis present

## 2016-01-10 DIAGNOSIS — I10 Essential (primary) hypertension: Secondary | ICD-10-CM

## 2016-01-10 DIAGNOSIS — Z79899 Other long term (current) drug therapy: Secondary | ICD-10-CM

## 2016-01-10 DIAGNOSIS — R0989 Other specified symptoms and signs involving the circulatory and respiratory systems: Secondary | ICD-10-CM

## 2016-01-10 DIAGNOSIS — Z8673 Personal history of transient ischemic attack (TIA), and cerebral infarction without residual deficits: Secondary | ICD-10-CM

## 2016-01-10 DIAGNOSIS — R296 Repeated falls: Secondary | ICD-10-CM | POA: Diagnosis present

## 2016-01-10 DIAGNOSIS — I482 Chronic atrial fibrillation: Secondary | ICD-10-CM | POA: Diagnosis present

## 2016-01-10 DIAGNOSIS — K219 Gastro-esophageal reflux disease without esophagitis: Secondary | ICD-10-CM | POA: Diagnosis present

## 2016-01-10 DIAGNOSIS — M6281 Muscle weakness (generalized): Secondary | ICD-10-CM

## 2016-01-10 DIAGNOSIS — E785 Hyperlipidemia, unspecified: Secondary | ICD-10-CM | POA: Diagnosis present

## 2016-01-10 DIAGNOSIS — I4891 Unspecified atrial fibrillation: Secondary | ICD-10-CM | POA: Diagnosis present

## 2016-01-10 LAB — BASIC METABOLIC PANEL
Anion gap: 7 (ref 5–15)
BUN: 13 mg/dL (ref 6–20)
CHLORIDE: 106 mmol/L (ref 101–111)
CO2: 27 mmol/L (ref 22–32)
CREATININE: 0.81 mg/dL (ref 0.44–1.00)
Calcium: 9.7 mg/dL (ref 8.9–10.3)
GFR calc non Af Amer: >60 mL/min (ref >60)
GLUCOSE: 114 mg/dL — AB (ref 65–99)
Potassium: 4.2 mmol/L (ref 3.5–5.1)
Sodium: 140 mmol/L (ref 135–145)

## 2016-01-10 LAB — CBC
HCT: 46.1 % — ABNORMAL HIGH (ref 36.0–46.0)
Hemoglobin: 15.2 g/dL — ABNORMAL HIGH (ref 12.0–15.0)
MCH: 28.4 pg (ref 26.0–34.0)
MCHC: 33 g/dL (ref 30.0–36.0)
MCV: 86.2 fL (ref 78.0–100.0)
Platelets: 162 10*3/uL (ref 150–400)
RBC: 5.35 MIL/uL — ABNORMAL HIGH (ref 3.87–5.11)
RDW: 14.4 % (ref 11.5–15.5)
WBC: 6 10*3/uL (ref 4.0–10.5)

## 2016-01-10 LAB — LACTIC ACID, PLASMA: Lactic Acid, Venous: 2.1 mmol/L (ref 0.5–1.9)

## 2016-01-10 LAB — TROPONIN I: Troponin I: <0.03 ng/mL (ref <0.03)

## 2016-01-10 MED ORDER — POTASSIUM CHLORIDE CRYS ER 20 MEQ PO TBCR
20.0000 meq | EXTENDED_RELEASE_TABLET | Freq: Two times a day (BID) | ORAL | Status: DC
  Administered 2016-01-11 – 2016-01-14 (×7): 20 meq via ORAL
  Filled 2016-01-10 (×7): qty 1

## 2016-01-10 MED ORDER — ACETAMINOPHEN 650 MG RE SUPP
650.0000 mg | Freq: Four times a day (QID) | RECTAL | Status: DC | PRN
Start: 2016-01-10 — End: 2016-01-14

## 2016-01-10 MED ORDER — LEVOFLOXACIN IN D5W 750 MG/150ML IV SOLN
750.0000 mg | Freq: Once | INTRAVENOUS | Status: AC
  Administered 2016-01-10: 750 mg via INTRAVENOUS
  Filled 2016-01-10: qty 150

## 2016-01-10 MED ORDER — ACETAMINOPHEN 325 MG PO TABS: 650.0000 mg | ORAL_TABLET | Freq: Four times a day (QID) | ORAL | Status: DC | PRN

## 2016-01-10 MED ORDER — PANTOPRAZOLE SODIUM 40 MG PO TBEC
40.0000 mg | DELAYED_RELEASE_TABLET | Freq: Every day | ORAL | Status: DC
  Administered 2016-01-11 – 2016-01-14 (×4): 40 mg via ORAL
  Filled 2016-01-10 (×4): qty 1

## 2016-01-10 MED ORDER — ASPIRIN EC 81 MG PO TBEC
81.0000 mg | DELAYED_RELEASE_TABLET | Freq: Every day | ORAL | Status: DC
  Administered 2016-01-11 – 2016-01-14 (×4): 81 mg via ORAL
  Filled 2016-01-10 (×4): qty 1

## 2016-01-10 MED ORDER — MOMETASONE FURO-FORMOTEROL FUM 200-5 MCG/ACT IN AERO
2.0000 | INHALATION_SPRAY | Freq: Two times a day (BID) | RESPIRATORY_TRACT | Status: DC
  Administered 2016-01-11 – 2016-01-14 (×6): 2 via RESPIRATORY_TRACT
  Filled 2016-01-10: qty 8.8

## 2016-01-10 MED ORDER — DILTIAZEM HCL ER 180 MG PO CP24
180.0000 mg | ORAL_CAPSULE | Freq: Every day | ORAL | Status: DC
  Administered 2016-01-11 – 2016-01-14 (×4): 180 mg via ORAL
  Filled 2016-01-10 (×10): qty 1

## 2016-01-10 MED ORDER — LEVOFLOXACIN IN D5W 750 MG/150ML IV SOLN
750.0000 mg | INTRAVENOUS | Status: DC
  Administered 2016-01-12: 750 mg via INTRAVENOUS
  Filled 2016-01-10: qty 150

## 2016-01-10 MED ORDER — SODIUM CHLORIDE 0.9 % IV SOLN
INTRAVENOUS | Status: DC
  Administered 2016-01-10 – 2016-01-13 (×3): via INTRAVENOUS

## 2016-01-10 MED ORDER — ALBUTEROL SULFATE (2.5 MG/3ML) 0.083% IN NEBU: 3.0000 mL | INHALATION_SOLUTION | Freq: Four times a day (QID) | RESPIRATORY_TRACT | Status: DC | PRN

## 2016-01-10 MED ORDER — EZETIMIBE 10 MG PO TABS
5.0000 mg | ORAL_TABLET | Freq: Every day | ORAL | Status: DC
  Administered 2016-01-11 – 2016-01-14 (×4): 5 mg via ORAL
  Filled 2016-01-10 (×4): qty 1

## 2016-01-10 MED ORDER — IPRATROPIUM-ALBUTEROL 0.5-2.5 (3) MG/3ML IN SOLN: 3.0000 mL | RESPIRATORY_TRACT | Status: DC | PRN

## 2016-01-10 MED ORDER — HEPARIN SODIUM (PORCINE) 5000 UNIT/ML IJ SOLN
5000.0000 [IU] | Freq: Three times a day (TID) | INTRAMUSCULAR | Status: DC
  Administered 2016-01-10 – 2016-01-14 (×11): 5000 [IU] via SUBCUTANEOUS
  Filled 2016-01-10 (×11): qty 1

## 2016-01-10 MED ORDER — CLONIDINE HCL 0.1 MG PO TABS
0.1000 mg | ORAL_TABLET | Freq: Every day | ORAL | Status: DC
  Administered 2016-01-11 – 2016-01-14 (×4): 0.1 mg via ORAL
  Filled 2016-01-10 (×4): qty 1

## 2016-01-10 NOTE — H&P (Signed)
History and Physical    Traci MainlandLena M Martin ZOX:[id number] DOB: 11-18-1924 DOA: 01/10/2016  PCP: Fredirick MaudlinHAWKINS,EDWARD L, MD Patient coming from: Home   Chief Complaint: Choking episode.   HPI: Traci Martin is a 82 y.o. female with medical history significant for cerebrals palsy, CHF, HLD, HTN, hx of dysphagia and atrial fibrillation not on any anticoagulation due to recurrent falls, presented by EMS with an episode of "choking". Patient states she has never had any trouble with swallowing. After her episode she began to have chest pain. She denies any further chest pain, shortness of breath, fever, or chills. Patient is generally non ambulatory.  She was given food in the ER without having any choking problem. While in the ED, her chest xray revealed possible atelectasis vs pneumonia, and despite no hx of coughs, fever, chills, or leukocytosis, and she was started on IV Levaquin by the EDP.  Hospitalist was asked to admit the patient for CAP.   ED Course: Serology unremarkable except for hemoglobin 15.2. CXR is consistent with pneumonia.   Review of Systems: As per HPI otherwise 10 point review of systems negative.    Past Medical History:  Diagnosis Date  . Arthritis   . Cerebral palsy (HCC)   . CHF (congestive heart failure) (HCC)   . Frequent falls   . History of pelvic fracture   . Hyperlipidemia   . Hypertension   . Osteoporosis   . Polio   . Stroke [facility](HCC)     Past Surgical History:  Procedure Laterality Date  . ESOPHAGOGASTRODUODENOSCOPY N/A 12/05/2012   Procedure: ESOPHAGOGASTRODUODENOSCOPY (EGD);  Surgeon: Malissa HippoNajeeb U Rehman, MD;  Location: [facility];  Service: Endoscopy;  Laterality: N/A;  . HIP PINNING       reports that she has never smoked. She has quit using smokeless tobacco. Her smokeless tobacco use included Snuff. She reports that she does not drink alcohol or use drugs.  No Known Allergies  Family History  Problem Relation Age of Onset  . Family history unknown:  Yes    Prior to Admission medications   Medication Sig Start Date End Date Taking? Authorizing Provider  albuterol (PROVENTIL HFA;VENTOLIN HFA) 108 (90 BASE) MCG/ACT inhaler Inhale 1-2 puffs into the lungs every 6 (six) hours as needed for wheezing. 10/28/12  Yes Donnetta HutchingBrian Cook, MD  aspirin EC 81 MG tablet Take 1 tablet (81 mg total) by mouth daily. 03/14/12  Yes Kari BaarsEdward Hawkins, MD  cloNIDine (CATAPRES) 0.1 MG tablet Take 0.1 mg by mouth daily.   Yes Historical Provider, MD  diltiazem (DILACOR XR) 180 MG 24 hr capsule Take 180 mg by mouth daily.   Yes Historical Provider, MD  ezetimibe (ZETIA) 10 MG tablet Take 5 mg by mouth daily.   Yes Historical Provider, MD  furosemide (LASIX) 40 MG tablet Take 40 mg by mouth daily. 12/30/15  Yes Historical Provider, MD  pantoprazole (PROTONIX) 40 MG tablet TAKE ONE TABLET BY MOUTH DAILY. 01/28/15  Yes Len Blalockerri L Setzer, NP  potassium chloride SA (K-DUR,KLOR-CON) 20 MEQ tablet Take 20 mEq by mouth 2 (two) times daily.   Yes Historical Provider, MD  SYMBICORT 160-4.5 MCG/ACT inhaler Inhale 2 puffs into the lungs 2 (two) times daily.  01/06/16  Yes Historical Provider, MD  ipratropium-albuterol (DUONEB) 0.5-2.5 (3) MG/3ML SOLN Take 3 mLs by nebulization every 4 (four) hours as needed (shortness of breath).    Historical Provider, MD    Physical Exam: Vitals:   01/10/16 1754 01/10/16 1800 01/10/16 1830 01/10/16 1900  BP: 136/61 133/55 161/79 146/88  Pulse: 70 87 91 85  Resp: 22 22 24 18   Temp:      TempSrc:      SpO2: 99% 93% 91% 90%  Weight:      Height:          Constitutional: NAD, calm, comfortable Vitals:   01/10/16 1754 01/10/16 1800 01/10/16 1830 01/10/16 1900  BP: 136/61 133/55 161/79 146/88  Pulse: 70 87 91 85  Resp: 22 22 24 18   Temp:      TempSrc:      SpO2: 99% 93% 91% 90%  Weight:      Height:       Eyes: PERRL, lids and conjunctivae normal ENMT: Mucous membranes are moist. Posterior pharynx clear of any exudate or lesions.Normal  dentition.  Neck: normal, supple, no masses, no thyromegaly Respiratory: clear to auscultation bilaterally, no wheezing, no crackles. Normal respiratory effort. No accessory muscle use.  Cardiovascular: Regular rate and rhythm, no murmurs / rubs / gallops. No extremity edema. 2+ pedal pulses. No carotid bruits.  Abdomen: no tenderness, no masses palpated. No hepatosplenomegaly. Bowel sounds positive.  Musculoskeletal: no clubbing / cyanosis. No joint deformity upper and lower extremities. Good ROM, no contractures. Normal muscle tone.  Skin: no rashes, lesions, ulcers. No induration Neurologic: CN 2-12 grossly intact. Sensation intact, DTR normal. Strength 5/5 in all 4.  Psychiatric: Normal judgment and insight. Alert and oriented x 3. Normal mood.    Labs on Admission: I have personally reviewed following labs and imaging studies   Recent Labs Lab 01/10/16 1647  WBC 6.0  HGB 15.2*  HCT 46.1*  MCV 86.2  PLT 162     Recent Labs Lab 01/10/16 1647  NA 140  K 4.2  CL 106  CO2 27  GLUCOSE 114*  BUN 13  CREATININE 0.81  CALCIUM 9.7    Recent Labs Lab 01/10/16 1647  TROPONINI <0.03       Component Value Date/Time   COLORURINE YELLOW 03/09/2012 1215   APPEARANCEUR CLEAR 03/09/2012 1215   LABSPEC >1.030 (H) 03/09/2012 1215   PHURINE 5.5 03/09/2012 1215   GLUCOSEU NEGATIVE 03/09/2012 1215   HGBUR NEGATIVE 03/09/2012 1215   BILIRUBINUR SMALL (A) 03/09/2012 1215   KETONESUR NEGATIVE 03/09/2012 1215   PROTEINUR 30 (A) 03/09/2012 1215   UROBILINOGEN 0.2 03/09/2012 1215   NITRITE NEGATIVE 03/09/2012 1215   LEUKOCYTESUR NEGATIVE 03/09/2012 1215     Radiological Exams on Admission: Dg Chest 1 View  Result Date: 01/10/2016 CLINICAL DATA:  Chest pain EXAM: CHEST 1 VIEW COMPARISON:  12/05/2012 FINDINGS: There is chronic elevation of the left diaphragm. There is left lower lobe airspace disease. There is bilateral interstitial thickening. There is no pleural effusion or  pneumothorax. There is stable cardiomegaly. The osseous structures are unremarkable. IMPRESSION: Cardiomegaly with pulmonary vascular congestion. Left lower lobe airspace disease which may reflect atelectasis versus pneumonia. Electronically Signed   By: Elige Ko   On: 01/10/2016 17:34    EKG: Independently reviewed. EKG shows atrial fibrillation.   Assessment/Plan  1. CAP:  I am not convinced she has PNA, but will empirically continue her on Levaquin.  CXR shows Left lower lobe airspace which is consistent with pneumonia vs atelectasis. Continue empiric antibiotic Levaquin, follow cultures, pulmonary hygiene.  2. Dysphagia. Patient was noted to have choked earlier today. NPO for now. Will consult speech for evaluation.   3. Atrial fibrillation. Patient has a CHADSVASC score of 7. She is not anticoagulated on  any medication due to recurrent falls. EKG shows atrial fibrillation. Continue to monitor.  4. CHF. Check echocardiogram. Continue lasix.  5. HLD. Continue Zetia.  6. HTN. Continue diltiazem  7. GERD. Continue Protonix.  8. Hx of stroke. Continue aspirin.   DVT prophylaxis: SQ heparin  Code Status: FULL  Family Communication: No family bedside Disposition Plan:Discharge home once improved.  Consults called: None  Admission status: Observation    Houston SirenPeter Jaxsin Bottomley, MD FACP [facility] If 7PM-7AM, please contact night-coverage [email or web address] Password [email or web address]  01/10/2016, 7:12 PM    By signing my name below, I, Cynda AcresHailei Fulton, attest that this documentation has been prepared under the direction and in the presence of Houston SirenPeter Elzora Cullins, MD. Electronically signed: Cynda AcresHailei Fulton, Scribe. 01/10/16 7:25 PM

## 2016-01-10 NOTE — ED Notes (Signed)
Pt 95% on room air. MD made aware

## 2016-01-10 NOTE — ED Notes (Signed)
Pt is alert with contracted R side- She nods yes and no with very soft response answers. Her niece is at bedside and reports she will go home as pt is being admitted.  Pt with change due to incontinence-

## 2016-01-10 NOTE — ED Notes (Signed)
Pt given sprite to drink, tolerating well. No difficulty swallowing, no vomiting. MD made aware.

## 2016-01-10 NOTE — ED Notes (Signed)
Me and tech TH took bath cloths cleaned up pt and changed her brief. Waiting on room

## 2016-01-10 NOTE — ED Notes (Signed)
Transporting pt to room

## 2016-01-10 NOTE — ED Notes (Signed)
CRITICAL VALUE ALERT  Critical value received:  Lactic acid 2.1   Date of notification:  01/10/16  Time of notification:  2021  Critical value read back:Yes.    Nurse who received alert:  RCockram  MD notified

## 2016-01-10 NOTE — ED Notes (Signed)
Call to floor for report 

## 2016-01-10 NOTE — ED Notes (Addendum)
Report to Alan Ripperlaire, RN Pt sat is 88-90 per cent on room air   Pt on 2 L O2/

## 2016-01-10 NOTE — ED Notes (Signed)
Went to do vitals lab in with pt  

## 2016-01-10 NOTE — ED Triage Notes (Signed)
Pt comes in from home by EMS. EMS states that family told them pt became choked while eating dinner. Pt has patent airway at this time. In route here pt stated she has had chest pain since this morning. Denies any pain at this time.   Pt has hx of stroke and is contracted on left side.

## 2016-01-10 NOTE — ED Provider Notes (Signed)
[facility]-EM[facility]Y DEPT Provider Note   CSN: [id number] Arrival date & time: 01/10/16  1639     History   Chief Complaint Chief Complaint  Patient presents with  . Dysphagia  . Chest Pain    HPI Traci Martin is a 82 y.o. female.  HPI  Pt was seen at 1640. Per pt, c/o sudden onset and resolution of one episode of "choking on food" that occurred at lunch today. Pt told EMS she has had "chest pain" since this morning, but pt states the CP started after the choking episode. Pt has significant hx of CVA and solid food dysphagia. Denies CP currently, no palpitations, no SOB/cough, no abd pain, no N/V/D, no back pain, no syncope/LOC.    Past Medical History:  Diagnosis Date  . Arthritis   . Cerebral palsy (HCC)   . CHF (congestive heart failure) (HCC)   . Frequent falls   . History of pelvic fracture   . Hyperlipidemia   . Hypertension   . Osteoporosis   . Polio   . Stroke [facility](HCC)     Patient Active Problem List   Diagnosis Date Noted  . Dysphagia, pharyngoesophageal phase 01/08/2014  . Rhabdomyolysis 03/12/2012  . Atrial fibrillation (HCC) 03/12/2012  . Weakness 03/12/2012  . Dehydration 03/12/2012  . Hypertension 03/12/2012  . Cerebral palsy (HCC) 03/12/2012  . FRACTURE, PELVIS, LEFT 05/12/2010    Past Surgical History:  Procedure Laterality Date  . ESOPHAGOGASTRODUODENOSCOPY N/A 12/05/2012   Procedure: ESOPHAGOGASTRODUODENOSCOPY (EGD);  Surgeon: Malissa HippoNajeeb U Rehman, MD;  Location: [facility] ENDO SUITE;  Service: Endoscopy;  Laterality: N/A;  . HIP PINNING      OB History    No data available       Home Medications    Prior to Admission medications   Medication Sig Start Date End Date Taking? Authorizing Provider  albuterol (PROVENTIL HFA;VENTOLIN HFA) 108 (90 BASE) MCG/ACT inhaler Inhale 1-2 puffs into the lungs every 6 (six) hours as needed for wheezing. 10/28/12   Donnetta HutchingBrian Cook, MD  aspirin EC 81 MG tablet Take 1 tablet (81 mg total) by mouth daily. 03/14/12   Kari BaarsEdward  Hawkins, MD  cloNIDine (CATAPRES) 0.1 MG tablet Take 0.1 mg by mouth daily.    Historical Provider, MD  diltiazem (DILACOR XR) 180 MG 24 hr capsule Take 180 mg by mouth daily.    Historical Provider, MD  ezetimibe (ZETIA) 10 MG tablet Take 5 mg by mouth daily.    Historical Provider, MD  ipratropium-albuterol (DUONEB) 0.5-2.5 (3) MG/3ML SOLN Take 3 mLs by nebulization every 4 (four) hours as needed (shortness of breath).    Historical Provider, MD  pantoprazole (PROTONIX) 40 MG tablet TAKE ONE TABLET BY MOUTH DAILY. 01/28/15   Len Blalockerri L Setzer, NP  potassium chloride SA (K-DUR,KLOR-CON) 20 MEQ tablet Take 20 mEq by mouth 2 (two) times daily.    Historical Provider, MD    Family History Family History  Problem Relation Age of Onset  . Family history unknown: Yes    Social History Social History  Substance Use Topics  . Smoking status: Never Smoker  . Smokeless tobacco: Former NeurosurgeonUser    Types: Snuff  . Alcohol use No     Allergies   Patient has no known allergies.   Review of Systems Review of Systems ROS: Statement: All systems negative except as marked or noted in the HPI; Constitutional: Negative for fever and chills. ; ; Eyes: Negative for eye pain, redness and discharge. ; ; ENMT: Negative for  ear pain, hoarseness, nasal congestion, sinus pressure and sore throat. ; ; Cardiovascular: +CP. Negative for palpitations, diaphoresis, dyspnea and peripheral edema. ; ; Respiratory: Negative for cough, wheezing and stridor. ; ; Gastrointestinal: +choking episode. Negative for nausea, vomiting, diarrhea, abdominal pain, blood in stool, hematemesis, jaundice and rectal bleeding. . ; ; Genitourinary: Negative for dysuria, flank pain and hematuria. ; ; Musculoskeletal: Negative for back pain and neck pain. Negative for swelling and trauma.; ; Skin: Negative for pruritus, rash, abrasions, blisters, bruising and skin lesion.; ; Neuro: Negative for headache, lightheadedness and neck stiffness. Negative  for weakness, altered level of consciousness, altered mental status, extremity weakness, paresthesias, involuntary movement, seizure and syncope.       Physical Exam Updated Vital Signs BP 168/68 (BP Location: Right Arm)   Pulse 86   Resp 18   Ht 5\' 2"  (1.575 m)   Wt 80 lb (36.3 kg)   SpO2 96%   BMI 14.63 kg/m    Patient Vitals for the past 24 hrs:  BP Temp Temp src Pulse Resp SpO2 Height Weight  01/10/16 1914 146/88 97.5 F (36.4 C) - 96 20 91 % - -  01/10/16 1900 146/88 - - 85 18 90 % - -  01/10/16 1830 161/79 - - 91 24 91 % - -  01/10/16 1800 133/55 - - 87 22 93 % - -  01/10/16 1754 136/61 - - 70 22 99 % - -  01/10/16 1718 142/55 98.4 F (36.9 C) Oral 82 23 95 % - -  01/10/16 1643 168/68 - - 86 18 96 % - -  01/10/16 1638 - - - - - - 5\' 2"  (1.575 m) 80 lb (36.3 kg)      Physical Exam 1645: Physical examination:  Nursing notes reviewed; Vital signs and O2 SAT reviewed;  Constitutional: Thin, frail. Well hydrated, In no acute distress; Head:  Normocephalic, atraumatic; Eyes: EOMI, PERRL, No scleral icterus; ENMT: Mouth and pharynx normal, Mucous membranes moist; Neck: Supple, Full range of motion, No lymphadenopathy; Cardiovascular: Irregular rate and rhythm, No gallop; Respiratory: Breath sounds coarse & equal bilaterally, No wheezes.  Speaking full sentences with ease, Normal respiratory effort/excursion; Chest: Nontender, Movement normal; Abdomen: Soft, Nontender, Nondistended, Normal bowel sounds; Genitourinary: No CVA tenderness; Extremities: Pulses normal, No tenderness, No edema, No calf edema or asymmetry.; Neuro: AA&Ox3, Speech per baseline. +contracted left side per hx..; Skin: Color normal, Warm, Dry.   ED Treatments / Results  Labs (all labs ordered are listed, but only abnormal results are displayed)   EKG  EKG Interpretation  Date/Time:  Sunday January 10 2016 16:39:58 EST Ventricular Rate:  84 PR Interval:    QRS Duration: 83 QT Interval:  395 QTC  Calculation: 467 R Axis:   55 Text Interpretation:  Atrial fibrillation Ventricular premature complex Abnormal R-wave progression, early transition Probable LVH with secondary repol abnrm Repol abnrm, global ischemia, diffuse leads When compared with ECG of 03/10/2012 Atrial fibrillation has replaced Normal sinus rhythm Confirmed by [facility]  MD, Nicholos JohnsKATHLEEN [phone]([id number]) on 01/10/2016 4:50:44 PM       Radiology   Procedures Procedures (including critical care time)  Medications Ordered in ED Medications - No data to display   Initial Impression / Assessment and Plan / ED Course  I have reviewed the triage vital signs and the nursing notes.  Pertinent labs & imaging results that were available during my care of the patient were reviewed by me and considered in my medical decision making (see chart  for details).  MDM Reviewed: previous chart, nursing note and vitals Reviewed previous: labs and ECG Interpretation: labs, ECG and x-ray    Results for orders placed or performed during the hospital encounter of 01/10/16  Basic metabolic panel  Result Value Ref Range   Sodium 140 135 - 145 mmol/L   Potassium 4.2 3.5 - 5.1 mmol/L   Chloride 106 101 - 111 mmol/L   CO2 27 22 - 32 mmol/L   Glucose, Bld 114 (H) 65 - 99 mg/dL   BUN 13 6 - 20 mg/dL   Creatinine, Ser 1.61 0.44 - 1.00 mg/dL   Calcium 9.7 8.9 - 09.6 mg/dL   GFR calc non Af Amer >60 >60 mL/min   GFR calc Af Amer >60 >60 mL/min   Anion gap 7 5 - 15  CBC  Result Value Ref Range   WBC 6.0 4.0 - 10.5 K/uL   RBC 5.35 (H) 3.87 - 5.11 MIL/uL   Hemoglobin 15.2 (H) 12.0 - 15.0 g/dL   HCT 04.5 (H) 40.9 - 81.1 %   MCV 86.2 78.0 - 100.0 fL   MCH 28.4 26.0 - 34.0 pg   MCHC 33.0 30.0 - 36.0 g/dL   RDW 91.4 78.2 - 95.6 %   Platelets 162 150 - 400 K/uL  Troponin I  Result Value Ref Range   Troponin I <0.03 <0.03 ng/mL   Dg Chest 1 View Result Date: 01/10/2016 CLINICAL DATA:  Chest pain EXAM: CHEST 1 VIEW COMPARISON:  12/05/2012  FINDINGS: There is chronic elevation of the left diaphragm. There is left lower lobe airspace disease. There is bilateral interstitial thickening. There is no pleural effusion or pneumothorax. There is stable cardiomegaly. The osseous structures are unremarkable. IMPRESSION: Cardiomegaly with pulmonary vascular congestion. Left lower lobe airspace disease which may reflect atelectasis versus pneumonia. Electronically Signed   By: Elige Ko   On: 01/10/2016 17:34    1915:  O2 Sats decreasing steadily when pt is removed off of O2 N/C. Will tx for CAP; still have concerns regarding possible aspiration. Pt did tol PO while in the ED without choking/gagging.  Dx and testing d/w pt and family.  Questions answered.  Verb understanding, agreeable to admit. T/C to [facility]. Conley Rolls, case discussed, including:  HPI, pertinent PM/SHx, VS/PE, dx testing, ED course and treatment:  Agreeable to admit, requests he will come to the ED for evaluation.    Final Clinical Impressions(s) / ED Diagnoses   Final diagnoses:  None    New Prescriptions New Prescriptions   No medications on file     Samuel Jester, DO 01/13/16 1251

## 2016-01-11 DIAGNOSIS — R296 Repeated falls: Secondary | ICD-10-CM | POA: Diagnosis present

## 2016-01-11 DIAGNOSIS — E43 Unspecified severe protein-calorie malnutrition: Secondary | ICD-10-CM | POA: Diagnosis present

## 2016-01-11 DIAGNOSIS — Z8673 Personal history of transient ischemic attack (TIA), and cerebral infarction without residual deficits: Secondary | ICD-10-CM | POA: Diagnosis not present

## 2016-01-11 DIAGNOSIS — Z7982 Long term (current) use of aspirin: Secondary | ICD-10-CM | POA: Diagnosis not present

## 2016-01-11 DIAGNOSIS — I10 Essential (primary) hypertension: Secondary | ICD-10-CM | POA: Diagnosis present

## 2016-01-11 DIAGNOSIS — Z681 Body mass index (BMI) 19 or less, adult: Secondary | ICD-10-CM | POA: Diagnosis not present

## 2016-01-11 DIAGNOSIS — Z8612 Personal history of poliomyelitis: Secondary | ICD-10-CM | POA: Diagnosis not present

## 2016-01-11 DIAGNOSIS — J690 Pneumonitis due to inhalation of food and vomit: Secondary | ICD-10-CM | POA: Diagnosis present

## 2016-01-11 DIAGNOSIS — K219 Gastro-esophageal reflux disease without esophagitis: Secondary | ICD-10-CM | POA: Diagnosis present

## 2016-01-11 DIAGNOSIS — Z79899 Other long term (current) drug therapy: Secondary | ICD-10-CM | POA: Diagnosis not present

## 2016-01-11 DIAGNOSIS — E785 Hyperlipidemia, unspecified: Secondary | ICD-10-CM | POA: Diagnosis present

## 2016-01-11 DIAGNOSIS — R0902 Hypoxemia: Secondary | ICD-10-CM | POA: Diagnosis present

## 2016-01-11 DIAGNOSIS — R079 Chest pain, unspecified: Secondary | ICD-10-CM | POA: Diagnosis present

## 2016-01-11 DIAGNOSIS — I482 Chronic atrial fibrillation: Secondary | ICD-10-CM | POA: Diagnosis present

## 2016-01-11 DIAGNOSIS — G809 Cerebral palsy, unspecified: Secondary | ICD-10-CM | POA: Diagnosis present

## 2016-01-11 DIAGNOSIS — R1314 Dysphagia, pharyngoesophageal phase: Secondary | ICD-10-CM | POA: Diagnosis present

## 2016-01-11 LAB — BASIC METABOLIC PANEL
ANION GAP: 6 (ref 5–15)
BUN: 11 mg/dL (ref 6–20)
CO2: 26 mmol/L (ref 22–32)
Calcium: 9 mg/dL (ref 8.9–10.3)
Chloride: 108 mmol/L (ref 101–111)
Creatinine, Ser: 0.66 mg/dL (ref 0.44–1.00)
GFR calc Af Amer: >60 mL/min (ref >60)
GLUCOSE: 105 mg/dL — AB (ref 65–99)
POTASSIUM: 3.7 mmol/L (ref 3.5–5.1)
Sodium: 140 mmol/L (ref 135–145)

## 2016-01-11 LAB — CBC
HEMATOCRIT: 38.6 % (ref 36.0–46.0)
HEMOGLOBIN: 12.5 g/dL (ref 12.0–15.0)
MCH: 27.7 pg (ref 26.0–34.0)
MCHC: 32.4 g/dL (ref 30.0–36.0)
MCV: 85.4 fL (ref 78.0–100.0)
Platelets: 166 10*3/uL (ref 150–400)
RBC: 4.52 MIL/uL (ref 3.87–5.11)
RDW: 14.3 % (ref 11.5–15.5)
WBC: 5.4 10*3/uL (ref 4.0–10.5)

## 2016-01-11 NOTE — Evaluation (Signed)
Clinical/Bedside Swallow Evaluation Patient Details  Name: Traci MainlandLena M Cumby MRN: [id number] Date of Birth: 04-28-1924  Today's Date: 01/11/2016 Time: SLP Start Time (ACUTE ONLY): 1310 SLP Stop Time (ACUTE ONLY): 1325 SLP Time Calculation (min) (ACUTE ONLY): 15 min  Past Medical History:  Past Medical History:  Diagnosis Date  . Arthritis   . Cerebral palsy (HCC)   . CHF (congestive heart failure) (HCC)   . Frequent falls   . History of pelvic fracture   . Hyperlipidemia   . Hypertension   . Osteoporosis   . Polio   . Stroke [facility](HCC)    Past Surgical History:  Past Surgical History:  Procedure Laterality Date  . ESOPHAGOGASTRODUODENOSCOPY N/A 12/05/2012   Procedure: ESOPHAGOGASTRODUODENOSCOPY (EGD);  Surgeon: Malissa HippoNajeeb U Rehman, MD;  Location: [facility] ENDO SU[facility];  Service: Endoscopy;  Laterality: N/A;  . HIP PINNING     HPI:  Traci Martin is a 82 y.o. female with medical history significant for cerebrals palsy, CHF, HLD, HTN, hx of dysphagia and atrial fibrillation not on any anticoagulation due to recurrent falls, presented by EMS with an episode of "choking". Patient states she has never had any trouble with swallowing. After her episode she began to have chest pain. She denies any further chest pain, shortness of breath, fever, or chills. Patient is generally non ambulatory.  She was given food in the ER without having any choking problem. While in the ED, her chest xray revealed possible atelectasis vs pneumonia, and despite no hx of coughs, fever, chills, or leukocytosis, and she was started on IV Levaquin by the EDP.  Hospitalist was asked to admit the patient for CAP. ST referral  d/t concerns of aspiration..   Assessment / Plan / Recommendation Clinical Impression  Pt. presents with impaired masitcation with regular consistency, Noted multiple swallows with thin liquids via spoon and coughing with  watery eyes in 2/3 trials. Thoat clear and cough are weak. ST recommends aspiration  precautions, assistance with feeding, mechanical soft solids, nectar thick liquids, whole medications. Noted thick white coating along tongue - alerted nurse. SLP will follow for diet tolerance and upgrades as appropriate.     Aspiration Risk  Mild aspiration risk    Diet Recommendation Dysphagia 3 (Mech soft)   Liquid Administration via: Cup Medication Administration: Whole meds with liquid Supervision: Staff to assist with self feeding Compensations: Other (Comment) (universal aspiration precautions ) Postural Changes: Seated upright at 90 degrees    Other  Recommendations Oral Care Recommendations: Oral care BID Other Recommendations: Remove water pitcher;Order thickener from pharmacy;Clarify dietary restrictions   Follow up Recommendations Other (comment) (to be determined )      Frequency and Duration Other (Comment) (1-2 x/wk )  1 week       Prognosis Prognosis for Safe Diet Advancement: Fair Barriers to Reach Goals: Other (Comment) (underlying impairments )      Swallow Study   General Date of Onset: 01/10/16 HPI: Traci MainlandLena M Cudworth is a 82 y.o. female with medical history significant for cerebrals palsy, CHF, HLD, HTN, hx of dysphagia and atrial fibrillation not on any anticoagulation due to recurrent falls, presented by EMS with an episode of "choking". Patient states she has never had any trouble with swallowing. After her episode she began to have chest pain. She denies any further chest pain, shortness of breath, fever, or chills. Patient is generally non ambulatory.  She was given food in the ER without having any choking problem. While in the ED, her chest  xray revealed possible atelectasis vs pneumonia, and despite no hx of coughs, fever, chills, or leukocytosis, and she was started on IV Levaquin by the EDP.  Hospitalist was asked to admit the patient for CAP. ST referral  d/t concerns of aspiration.. Type of Study: Bedside Swallow Evaluation Diet Prior to this Study:  Regular Temperature Spikes Noted: No Respiratory Status: Nasal cannula History of Recent Intubation: No Oral Cavity Assessment: Other (comment) (white build up on lingual musculature -possibly thrush) Oral Care Completed by SLP: Yes Oral Cavity - Dentition: Edentulous Vision: Functional for self-feeding Patient Positioning: Upright in bed Baseline Vocal Quality: Normal Volitional Cough: Weak Volitional Swallow: Able to elicit    Oral/Motor/Sensory Function Overall Oral Motor/Sensory Function: Within functional limits   Ice Chips Ice chips: Within functional limits Presentation: Spoon   Thin Liquid Thin Liquid: Impaired Presentation: Spoon;Cup Oral Phase Impairments: Poor awareness of bolus Pharyngeal  Phase Impairments: Multiple swallows    Nectar Thick Nectar Thick Liquid: Within functional limits Presentation: Spoon;Cup   Honey Thick Honey Thick Liquid: Not tested   Puree Puree: Within functional limits   Solid   GO   Solid: Impaired Presentation: Self Fed (SLP fed ) Oral Phase Impairments: Impaired mastication (with regular consistency solids ) Oral Phase Functional Implications: Impaired mastication (with regular solids ) Pharyngeal Phase Impairments: Multiple swallows Other Comments: ST recommends mechanical soft solids     Functional Assessment Tool Used: clinical judgement  Functional Limitations: Swallowing Swallow Current Status ([id number]): At least 40 percent but less than 60 percent impaired, limited or restricted Swallow Goal Status [phone]([id number]): At least 20 percent but less than 40 percent impaired, limited or restricted   Addison NaegeliAshley Celestine Bougie 01/11/2016,2:15 PM      Thank you,   Danella MaiersAshley M. Orvan Falconerampbell, MS, CCC-SLP  Speech-Language Pathologist [phone]

## 2016-01-11 NOTE — Progress Notes (Signed)
Subjective: She was admitted yesterday with community-acquired pneumonia. She says she feels okay. It seemed like she had potential aspiration. No other new complaints. No abdominal pain nausea no chest pain.  Objective: Vital signs in last 24 hours: Temp:  [97.5 F (36.4 C)-99.3 F (37.4 C)] 98.1 F (36.7 C) (11/13 0543) Pulse Rate:  [70-97] 83 (11/13 0543) Resp:  [16-24] 18 (11/13 0543) BP: (133-168)/(55-93) 147/55 (11/13 0543) SpO2:  [87 %-100 %] 100 % (11/13 0543) FiO2 (%):  [100 %] 100 % (11/12 2128) Weight:  [36.3 kg (80 lb)-42.9 kg (94 lb 9.2 oz)] 42.9 kg (94 lb 9.2 oz) (11/12 2128) Weight change:  Last BM Date: 01/11/16  Intake/Output from previous day: No intake/output data recorded.  PHYSICAL EXAM General appearance: alert, cooperative and mild distress Resp: rhonchi Bilaterally but right greater than left Cardio: irregularly irregular rhythm GI: soft, non-tender; bowel sounds normal; no masses,  no organomegaly Extremities: She has chronic abnormalities on her left side with wasting of musculature etc. related to cerebral palsy Skin warm and dry. Mucous membranes slightly dry.  Lab Results:  Results for orders placed or performed during the hospital encounter of 01/10/16 (from the past 48 hour(s))  Basic metabolic panel     Status: Abnormal   Collection Time: 01/10/16  4:47 PM  Result Value Ref Range   Sodium 140 135 - 145 mmol/L   Potassium 4.2 3.5 - 5.1 mmol/L   Chloride 106 101 - 111 mmol/L   CO2 27 22 - 32 mmol/L   Glucose, Bld 114 (H) 65 - 99 mg/dL   BUN 13 6 - 20 mg/dL   Creatinine, Ser 0.81 0.44 - 1.00 mg/dL   Calcium 9.7 8.9 - 10.3 mg/dL   GFR calc non Af Amer >60 >60 mL/min   GFR calc Af Amer >60 >60 mL/min    Comment: (NOTE) The eGFR has been calculated using the CKD EPI equation. This calculation has not been validated in all clinical situations. eGFR's persistently <60 mL/min signify possible Chronic Kidney Disease.    Anion gap 7 5 - 15  CBC      Status: Abnormal   Collection Time: 01/10/16  4:47 PM  Result Value Ref Range   WBC 6.0 4.0 - 10.5 K/uL   RBC 5.35 (H) 3.87 - 5.11 MIL/uL   Hemoglobin 15.2 (H) 12.0 - 15.0 g/dL   HCT 46.1 (H) 36.0 - 46.0 %   MCV 86.2 78.0 - 100.0 fL   MCH 28.4 26.0 - 34.0 pg   MCHC 33.0 30.0 - 36.0 g/dL   RDW 14.4 11.5 - 15.5 %   Platelets 162 150 - 400 K/uL  Troponin I     Status: None   Collection Time: 01/10/16  4:47 PM  Result Value Ref Range   Troponin I <0.03 <0.03 ng/mL  Lactic acid, plasma     Status: Abnormal   Collection Time: 01/10/16  6:50 PM  Result Value Ref Range   Lactic Acid, Venous 2.1 (HH) 0.5 - 1.9 mmol/L    Comment: CRITICAL RESULT CALLED TO, READ BACK BY AND VERIFIED WITH: [email or web address] AT 2020 ON 01/10/16 BY [email or web address]   Basic metabolic panel     Status: Abnormal   Collection Time: 01/11/16  7:09 AM  Result Value Ref Range   Sodium 140 135 - 145 mmol/L   Potassium 3.7 3.5 - 5.1 mmol/L   Chloride 108 101 - 111 mmol/L   CO2 26 22 - 32 mmol/L   Glucose, Bld 105 (H)  65 - 99 mg/dL   BUN 11 6 - 20 mg/dL   Creatinine, Ser 0.66 0.44 - 1.00 mg/dL   Calcium 9.0 8.9 - 10.3 mg/dL   GFR calc non Af Amer >60 >60 mL/min   GFR calc Af Amer >60 >60 mL/min    Comment: (NOTE) The eGFR has been calculated using the CKD EPI equation. This calculation has not been validated in all clinical situations. eGFR's persistently <60 mL/min signify possible Chronic Kidney Disease.    Anion gap 6 5 - 15  CBC     Status: None   Collection Time: 01/11/16  7:09 AM  Result Value Ref Range   WBC 5.4 4.0 - 10.5 K/uL   RBC 4.52 3.87 - 5.11 MIL/uL   Hemoglobin 12.5 12.0 - 15.0 g/dL   HCT 38.6 36.0 - 46.0 %   MCV 85.4 78.0 - 100.0 fL   MCH 27.7 26.0 - 34.0 pg   MCHC 32.4 30.0 - 36.0 g/dL   RDW 14.3 11.5 - 15.5 %   Platelets 166 150 - 400 K/uL    ABGS No results for input(s): PHART, PO2ART, TCO2, HCO3 in the last 72 hours.  Invalid input(s): PCO2 CULTURES No results found for this or any  previous visit (from the past 240 hour(s)). Studies/Results: Dg Chest 1 View  Result Date: 01/10/2016 CLINICAL DATA:  Chest pain EXAM: CHEST 1 VIEW COMPARISON:  12/05/2012 FINDINGS: There is chronic elevation of the left diaphragm. There is left lower lobe airspace disease. There is bilateral interstitial thickening. There is no pleural effusion or pneumothorax. There is stable cardiomegaly. The osseous structures are unremarkable. IMPRESSION: Cardiomegaly with pulmonary vascular congestion. Left lower lobe airspace disease which may reflect atelectasis versus pneumonia. Electronically Signed   By: Kathreen Devoid   On: 01/10/2016 17:34    Medications:  Prior to Admission:  Prescriptions Prior to Admission  Medication Sig Dispense Refill Last Dose  . albuterol (PROVENTIL HFA;VENTOLIN HFA) 108 (90 BASE) MCG/ACT inhaler Inhale 1-2 puffs into the lungs every 6 (six) hours as needed for wheezing. 1 Inhaler 0 unknown  . aspirin EC 81 MG tablet Take 1 tablet (81 mg total) by mouth daily. 30 tablet 12 01/09/2016 at Unknown time  . cloNIDine (CATAPRES) 0.1 MG tablet Take 0.1 mg by mouth daily.   01/09/2016 at Unknown time  . diltiazem (DILACOR XR) 180 MG 24 hr capsule Take 180 mg by mouth daily.   01/09/2016 at Unknown time  . ezetimibe (ZETIA) 10 MG tablet Take 5 mg by mouth daily.   01/09/2016 at Unknown time  . furosemide (LASIX) 40 MG tablet Take 40 mg by mouth daily.   01/09/2016 at Unknown time  . pantoprazole (PROTONIX) 40 MG tablet TAKE ONE TABLET BY MOUTH DAILY. 30 tablet 11 01/09/2016 at Unknown time  . potassium chloride SA (K-DUR,KLOR-CON) 20 MEQ tablet Take 20 mEq by mouth 2 (two) times daily.   01/09/2016 at Unknown time  . SYMBICORT 160-4.5 MCG/ACT inhaler Inhale 2 puffs into the lungs 2 (two) times daily.    01/09/2016 at Unknown time  . ipratropium-albuterol (DUONEB) 0.5-2.5 (3) MG/3ML SOLN Take 3 mLs by nebulization every 4 (four) hours as needed (shortness of breath).   unknown    Scheduled: . aspirin EC  81 mg Oral Daily  . cloNIDine  0.1 mg Oral Daily  . diltiazem  180 mg Oral Daily  . ezetimibe  5 mg Oral Daily  . heparin  5,000 Units Subcutaneous Q8H  . [START ON  01/12/2016] levofloxacin (LEVAQUIN) IV  750 mg Intravenous Q48H  . mometasone-formoterol  2 puff Inhalation BID  . pantoprazole  40 mg Oral Daily  . potassium chloride SA  20 mEq Oral BID   Continuous: . sodium chloride 75 mL/hr at 01/10/16 2249   MBW:[id number] **OR** acetaminophen, albuterol, ipratropium-albuterol  Assesment:She was admitted with community-acquired pneumonia. She has chronic atrial fib not anticoagulated because of multiple falls. She has cerebral palsy and seem to have some trouble swallowing so she'll need speech evaluation. Principal Problem:   CAP (community acquired pneumonia) Active Problems:   Atrial fibrillation ([place])   Hypertension   Cerebral palsy ([place])   Dysphagia, pharyngoesophageal phase    Plan: Continue current treatments. Request speech evaluation.    LOS: 0 days   Xiadani Damman L 01/11/2016, 8:47 AM

## 2016-01-12 NOTE — Clinical Social Work Note (Signed)
Patient was oriented to self/person. She stated that uses a wheelchair and lives alone. CSW left a message for patient's niece listed on chart to discuss discharge plan.     Fauna Neuner, Juleen ChinaHeather D, LCSW

## 2016-01-12 NOTE — Progress Notes (Signed)
Subjective: She says she feels okay. She is at risk for aspiration. No other new complaints. No nausea vomiting diarrhea.  Objective: Vital signs in last 24 hours: Temp:  [97.8 F (36.6 C)-98.1 F (36.7 C)] 97.8 F (36.6 C) (11/14 0415) Pulse Rate:  [61-86] 82 (11/14 0415) Resp:  [18-20] 18 (11/14 0415) BP: (113-171)/(47-76) 171/75 (11/14 0415) SpO2:  [95 %-100 %] 98 % (11/14 0759) Weight change:  Last BM Date: 01/10/16  Intake/Output from previous day: 11/13 0701 - 11/14 0700 In: 2401.3 [P.O.:140; I.V.:2261.3] Out: -   PHYSICAL EXAM General appearance: alert, cooperative and no distress Resp: rhonchi bilaterally Cardio: irregularly irregular rhythm GI: soft, non-tender; bowel sounds normal; no masses,  no organomegaly Extremities: Wasting of the left side She has chronic left hemiparesis from cerebral palsy. Skin warm and dry.  Lab Results:  Results for orders placed or performed during the hospital encounter of 01/10/16 (from the past 48 hour(s))  Basic metabolic panel     Status: Abnormal   Collection Time: 01/10/16  4:47 PM  Result Value Ref Range   Sodium 140 135 - 145 mmol/L   Potassium 4.2 3.5 - 5.1 mmol/L   Chloride 106 101 - 111 mmol/L   CO2 27 22 - 32 mmol/L   Glucose, Bld 114 (H) 65 - 99 mg/dL   BUN 13 6 - 20 mg/dL   Creatinine, Ser 0.81 0.44 - 1.00 mg/dL   Calcium 9.7 8.9 - 10.3 mg/dL   GFR calc non Af Amer >60 >60 mL/min   GFR calc Af Amer >60 >60 mL/min    Comment: (NOTE) The eGFR has been calculated using the CKD EPI equation. This calculation has not been validated in all clinical situations. eGFR's persistently <60 mL/min signify possible Chronic Kidney Disease.    Anion gap 7 5 - 15  CBC     Status: Abnormal   Collection Time: 01/10/16  4:47 PM  Result Value Ref Range   WBC 6.0 4.0 - 10.5 K/uL   RBC 5.35 (H) 3.87 - 5.11 MIL/uL   Hemoglobin 15.2 (H) 12.0 - 15.0 g/dL   HCT 46.1 (H) 36.0 - 46.0 %   MCV 86.2 78.0 - 100.0 fL   MCH 28.4 26.0 -  34.0 pg   MCHC 33.0 30.0 - 36.0 g/dL   RDW 14.4 11.5 - 15.5 %   Platelets 162 150 - 400 K/uL  Troponin I     Status: None   Collection Time: 01/10/16  4:47 PM  Result Value Ref Range   Troponin I <0.03 <0.03 ng/mL  Lactic acid, plasma     Status: Abnormal   Collection Time: 01/10/16  6:50 PM  Result Value Ref Range   Lactic Acid, Venous 2.1 (HH) 0.5 - 1.9 mmol/L    Comment: CRITICAL RESULT CALLED TO, READ BACK BY AND VERIFIED WITH: [email or web address] AT 2020 ON 01/10/16 BY [email or web address]   Basic metabolic panel     Status: Abnormal   Collection Time: 01/11/16  7:09 AM  Result Value Ref Range   Sodium 140 135 - 145 mmol/L   Potassium 3.7 3.5 - 5.1 mmol/L   Chloride 108 101 - 111 mmol/L   CO2 26 22 - 32 mmol/L   Glucose, Bld 105 (H) 65 - 99 mg/dL   BUN 11 6 - 20 mg/dL   Creatinine, Ser 0.66 0.44 - 1.00 mg/dL   Calcium 9.0 8.9 - 10.3 mg/dL   GFR calc non Af Amer >60 >60 mL/min   GFR calc Af  Amer >60 >60 mL/min    Comment: (NOTE) The eGFR has been calculated using the CKD EPI equation. This calculation has not been validated in all clinical situations. eGFR's persistently <60 mL/min signify possible Chronic Kidney Disease.    Anion gap 6 5 - 15  CBC     Status: None   Collection Time: 01/11/16  7:09 AM  Result Value Ref Range   WBC 5.4 4.0 - 10.5 K/uL   RBC 4.52 3.87 - 5.11 MIL/uL   Hemoglobin 12.5 12.0 - 15.0 g/dL   HCT 38.6 36.0 - 46.0 %   MCV 85.4 78.0 - 100.0 fL   MCH 27.7 26.0 - 34.0 pg   MCHC 32.4 30.0 - 36.0 g/dL   RDW 14.3 11.5 - 15.5 %   Platelets 166 150 - 400 K/uL    ABGS No results for input(s): PHART, PO2ART, TCO2, HCO3 in the last 72 hours.  Invalid input(s): PCO2 CULTURES No results found for this or any previous visit (from the past 240 hour(s)). Studies/Results: Dg Chest 1 View  Result Date: 01/10/2016 CLINICAL DATA:  Chest pain EXAM: CHEST 1 VIEW COMPARISON:  12/05/2012 FINDINGS: There is chronic elevation of the left diaphragm. There is left lower lobe  airspace disease. There is bilateral interstitial thickening. There is no pleural effusion or pneumothorax. There is stable cardiomegaly. The osseous structures are unremarkable. IMPRESSION: Cardiomegaly with pulmonary vascular congestion. Left lower lobe airspace disease which may reflect atelectasis versus pneumonia. Electronically Signed   By: Kathreen Devoid   On: 01/10/2016 17:34    Medications:  Prior to Admission:  Prescriptions Prior to Admission  Medication Sig Dispense Refill Last Dose  . albuterol (PROVENTIL HFA;VENTOLIN HFA) 108 (90 BASE) MCG/ACT inhaler Inhale 1-2 puffs into the lungs every 6 (six) hours as needed for wheezing. 1 Inhaler 0 unknown  . aspirin EC 81 MG tablet Take 1 tablet (81 mg total) by mouth daily. 30 tablet 12 01/09/2016 at Unknown time  . cloNIDine (CATAPRES) 0.1 MG tablet Take 0.1 mg by mouth daily.   01/09/2016 at Unknown time  . diltiazem (DILACOR XR) 180 MG 24 hr capsule Take 180 mg by mouth daily.   01/09/2016 at Unknown time  . ezetimibe (ZETIA) 10 MG tablet Take 5 mg by mouth daily.   01/09/2016 at Unknown time  . furosemide (LASIX) 40 MG tablet Take 40 mg by mouth daily.   01/09/2016 at Unknown time  . pantoprazole (PROTONIX) 40 MG tablet TAKE ONE TABLET BY MOUTH DAILY. 30 tablet 11 01/09/2016 at Unknown time  . potassium chloride SA (K-DUR,KLOR-CON) 20 MEQ tablet Take 20 mEq by mouth 2 (two) times daily.   01/09/2016 at Unknown time  . SYMBICORT 160-4.5 MCG/ACT inhaler Inhale 2 puffs into the lungs 2 (two) times daily.    01/09/2016 at Unknown time  . ipratropium-albuterol (DUONEB) 0.5-2.5 (3) MG/3ML SOLN Take 3 mLs by nebulization every 4 (four) hours as needed (shortness of breath).   unknown   Scheduled: . aspirin EC  81 mg Oral Daily  . cloNIDine  0.1 mg Oral Daily  . diltiazem  180 mg Oral Daily  . ezetimibe  5 mg Oral Daily  . heparin  5,000 Units Subcutaneous Q8H  . levofloxacin (LEVAQUIN) IV  750 mg Intravenous Q48H  . mometasone-formoterol  2  puff Inhalation BID  . pantoprazole  40 mg Oral Daily  . potassium chloride SA  20 mEq Oral BID   Continuous: . sodium chloride 75 mL/hr at 01/11/16 2358  HWY:[id number] **OR** acetaminophen, albuterol, ipratropium-albuterol  Assesment:She is admitted with community-acquired pneumonia related to aspiration and dysphagia. She has cerebral palsy. I received a call from her caretaker yesterday requesting that we evaluate her to see if she would be appropriate for placement in a skilled care facility or assisted living facility Principal Problem:   CAP (community acquired pneumonia) Active Problems:   Atrial fibrillation ([place])   Hypertension   Cerebral palsy ([place])   Dysphagia, pharyngoesophageal phase    Plan: Continue treatments. Discussed with social work and will order PT evaluation    LOS: 1 day   Traci Martin L 01/12/2016, 9:08 AM

## 2016-01-13 NOTE — Progress Notes (Signed)
Subjective: She seems to be doing better. She has no complaints of shortness of breath. She is coughing. No chest pain. No hemoptysis. No abdominal pain nausea or vomiting.  Objective: Vital signs in last 24 hours: Temp:  [98 F (36.7 C)-99.2 F (37.3 C)] 98.9 F (37.2 C) (11/15 0624) Pulse Rate:  [70-81] 70 (11/15 0624) Resp:  [18] 18 (11/15 0624) BP: (135-180)/(72-88) 180/72 (11/15 0624) SpO2:  [93 %-99 %] 96 % (11/15 0624) Weight change:  Last BM Date: 01/11/16  Intake/Output from previous day: 11/14 0701 - 11/15 0700 In: 2162.5 [P.O.:360; I.V.:1652.5; IV Piggyback:150] Out: -   PHYSICAL EXAM General appearance: alert, cooperative and mild distress Resp: rhonchi bilaterally Cardio: Although she has a history of atrial fib her heart seems regular now by exam. GI: soft, non-tender; bowel sounds normal; no masses,  no organomegaly Extremities: extremities normal, atraumatic, no cyanosis or edema Skin warm and dry. Chronic left hemiparesis from cerebral palsy  Lab Results:  No results found for this or any previous visit (from the past 48 hour(s)).  ABGS No results for input(s): PHART, PO2ART, TCO2, HCO3 in the last 72 hours.  Invalid input(s): PCO2 CULTURES No results found for this or any previous visit (from the past 240 hour(s)). Studies/Results: No results found.  Medications:  Prior to Admission:  Prescriptions Prior to Admission  Medication Sig Dispense Refill Last Dose  . albuterol (PROVENTIL HFA;VENTOLIN HFA) 108 (90 BASE) MCG/ACT inhaler Inhale 1-2 puffs into the lungs every 6 (six) hours as needed for wheezing. 1 Inhaler 0 unknown  . aspirin EC 81 MG tablet Take 1 tablet (81 mg total) by mouth daily. 30 tablet 12 01/09/2016 at Unknown time  . cloNIDine (CATAPRES) 0.1 MG tablet Take 0.1 mg by mouth daily.   01/09/2016 at Unknown time  . diltiazem (DILACOR XR) 180 MG 24 hr capsule Take 180 mg by mouth daily.   01/09/2016 at Unknown time  . ezetimibe (ZETIA)  10 MG tablet Take 5 mg by mouth daily.   01/09/2016 at Unknown time  . furosemide (LASIX) 40 MG tablet Take 40 mg by mouth daily.   01/09/2016 at Unknown time  . pantoprazole (PROTONIX) 40 MG tablet TAKE ONE TABLET BY MOUTH DAILY. 30 tablet 11 01/09/2016 at Unknown time  . potassium chloride SA (K-DUR,KLOR-CON) 20 MEQ tablet Take 20 mEq by mouth 2 (two) times daily.   01/09/2016 at Unknown time  . SYMBICORT 160-4.5 MCG/ACT inhaler Inhale 2 puffs into the lungs 2 (two) times daily.    01/09/2016 at Unknown time  . ipratropium-albuterol (DUONEB) 0.5-2.5 (3) MG/3ML SOLN Take 3 mLs by nebulization every 4 (four) hours as needed (shortness of breath).   unknown   Scheduled: . aspirin EC  81 mg Oral Daily  . cloNIDine  0.1 mg Oral Daily  . diltiazem  180 mg Oral Daily  . ezetimibe  5 mg Oral Daily  . heparin  5,000 Units Subcutaneous Q8H  . levofloxacin (LEVAQUIN) IV  750 mg Intravenous Q48H  . mometasone-formoterol  2 puff Inhalation BID  . pantoprazole  40 mg Oral Daily  . potassium chloride SA  20 mEq Oral BID   Continuous: . sodium chloride 75 mL/hr at 01/13/16 0518   ZOX:[id number]:acetaminophen **OR** acetaminophen, albuterol, ipratropium-albuterol  Assesment:She was admitted with community-acquired pneumonia may be from aspiration. She had speech evaluation and is at risk for aspirating. She's had atrial fib but seems to be in sinus rhythm now. Her situation is complicated by cerebral palsy. Her caretakers  called my office and said they wanted to see if she was a candidate for placement and we are working on that. Principal Problem:   CAP (community acquired pneumonia) Active Problems:   Atrial fibrillation ([facility])   Hypertension   Cerebral palsy ([facility])   Dysphagia, pharyngoesophageal phase    Plan: PT consultation. Continue other treatments. No changes otherwise.    LOS: 2 days   Traci Martin 01/13/2016, 8:33 AM

## 2016-01-13 NOTE — Care Management Note (Signed)
Case Management Note  Patient Details  Name: Traci Martin MRN: [id number] Date of Birth: 11-Oct-1924  Subjective/Objective:                  Pt is from home, lives in her own home and has care from aids and family/friends. Pt's niece, Traci Martin, is primary caregiver. At baseline pt uses wheelchair for mobility. Traci Martin has chosen [facility] from list of [facility] providers. She understands HH has 48 hrs to initiate services. Pt will also need supplemental oxygen. Pt has met requirements. Romualdo Bolk, of [facility], aware of referral and will obtain pt info from chart and will deliver port O2 tank to pt's room prior to DC. Pt's niece will come pick up pt once pt's port O2 as been delivered  Action/Plan: DC home today with [facility] services through [facility].   Expected Discharge Date:    01/14/2016              Expected Discharge Plan:  [place]  In-House Referral:  Clinical Social Work  Discharge planning Services  CM Consult  Post Acute Care Choice:  Home Health Choice offered to:  [facility] / [facility]  DME Arranged:    continuous oxygen DME Agency:    [facility]  HH Arranged:  Therapist, sports, PT [place] Agency:  [place]  Status of Service:  Completed, signed off  Sherald Barge, RN 01/13/2016, 3:01 PM

## 2016-01-13 NOTE — Evaluation (Signed)
Physical Therapy Evaluation Patient Details Name: Traci MainlandLena M Leventhal MRN: [id number] DOB: 10/30/1924 Today's Date: 01/13/2016   History of Present Illness  82 y.o. female with medical history significant for cerebrals palsy, CHF, HLD, HTN, hx of dysphagia and atrial fibrillation not on any anticoagulation due to recurrent falls, presented by EMS with an episode of "choking". Patient states she has never had any trouble with swallowing. After her episode she began to have chest pain. She denies any further chest pain, shortness of breath, fever, or chills. Patient is generally non ambulatory.  She was given food in the ER without having any choking problem. While in the ED, her chest xray revealed possible atelectasis vs pneumonia, and despite no hx of coughs, fever, chills, or leukocytosis, and she was started on IV Levaquin by the EDP.  Hospitalist was asked to admit the patient for CAP.   Clinical Impression  Pt received in bed, and was agreeable to PT evaluation.  She is requesting to get OOB and into the chair.  She is only oriented to self, however uncertain if this is her baseline.  She lives alone, but has 6 hours of aides that come to help her throughout the day, family comes to assist her, as well as one of her aides who comes after hours to help also.  She has a PMH of cerebral palsy with severe L UE contractures, required Mod A for supine<>sit, and total A for SPT from the bed<>recliner chair.  Pt states this is how she mobilizes at home.  Although she lives alone, and is incontinent, her skin integrity is intact.  Pt is recommended to have 24/7 supervision/assistance due to extensive need for assistance with functional mobility and transfers.  She is also recommended for HHPT to aide with improving transfers at home, and improving her strength to improve her transfers.     Follow Up Recommendations Supervision/Assistance - 24 hour;Home health PT    Equipment Recommendations  None recommended  by PT    Recommendations for Other Services       Precautions / Restrictions Precautions Precautions: Fall Precaution Comments: Due to pt's immobility.  Restrictions Weight Bearing Restrictions: No      Mobility  Bed Mobility Overal bed mobility: Needs Assistance Bed Mobility: Supine to Sit     Supine to sit: Mod assist;HOB elevated     General bed mobility comments: Pt assisted with getting LE's off the EOB, as well as lifting trunk up into seated position.  Use of bed pad to scoot hips to the EOB.    Transfers Overall transfer level: Needs assistance Equipment used: None Transfers: Stand Pivot Transfers   Stand pivot transfers: Total assist       General transfer comment: Pt states that at home, they just lift her into the chair going to the right.  Pt is not able to assist with this transfer.  Once in the chair, pt requires Max A for positioning in the chair.   Ambulation/Gait                Stairs            Wheelchair Mobility    Modified Rankin (Stroke Patients Only)       Balance Overall balance assessment: Needs assistance Sitting-balance support: Single extremity supported;Feet supported Sitting balance-Leahy Scale: Poor  Pertinent Vitals/Pain Pain Assessment: No/denies pain    Home Living   Living Arrangements: Alone Available Help at Discharge:  (pt has an aide that comes from 9am-12pm, and then another aide that comes from 3pm-6pm.  She has family that comes to check on her in between the hours of 12pm-3pm, and then one of her aides lives across the street and will come assist her after hours.  )         Home Layout: One level Home Equipment: Tub bench;Wheelchair - Child psychotherapist / Transfers Assistance Needed: Pt states is able to propel the w/c with her feet.  Pt states that "they put me in the chair"   ADL's / Homemaking Assistance  Needed: Aides assist with ADL's and IADL's         Hand Dominance        Extremity/Trunk Assessment   Upper Extremity Assessment: LUE deficits/detail;RUE deficits/detail RUE Deficits / Details: Grossly 3/5     LUE Deficits / Details: contracted in shoulder adduction, and complete elbow flexion, and non-use of her L UE.   Lower Extremity Assessment: LLE deficits/detail;RLE deficits/detail RLE Deficits / Details: R LE grossly 2/5 LLE Deficits / Details: plantar flexion contracture with what seems like spastic hemiplegia of the L LE.       Communication   Communication:  (some difficulty understanding speech - thick tongue. )  Cognition Arousal/Alertness: Awake/alert Behavior During Therapy: WFL for tasks assessed/performed Overall Cognitive Status: Impaired/Different from baseline Area of Impairment: Orientation Orientation Level: Disoriented to;Place;Situation;Time ("Cuz they told me to come and sit down."  Pt cannot state the year - keeps repeating "November"  )                  General Comments      Exercises     Assessment/Plan    PT Assessment Patient needs continued PT services  PT Problem List Decreased strength;Decreased range of motion;Decreased activity tolerance;Decreased balance;Decreased mobility;Decreased cognition;Cardiopulmonary status limiting activity;Impaired tone          PT Treatment Interventions DME instruction;Functional mobility training;Therapeutic activities;Therapeutic exercise;Patient/family education;Wheelchair mobility training    PT Goals (Current goals can be found in the Care Plan section)  Acute Rehab PT Goals Patient Stated Goal: Pt wants to go home.  PT Goal Formulation: With patient Time For Goal Achievement: 01/20/16 Potential to Achieve Goals: Fair    Frequency Min 2X/week   Barriers to discharge Decreased caregiver support Pt lives alone.  She has 6 hours of assistance from aides, and then she has family that stop  in intermittently.      Co-evaluation               End of Session Equipment Utilized During Treatment: Gait belt;Oxygen Activity Tolerance: Patient tolerated treatment well Patient left: in chair;with call bell/phone within reach;with nursing/sitter in room Nurse Communication: Mobility status (Pt is a total assist for SPT vs Maxi move)    Functional Assessment Tool Used: [employer] AM-PAC "6-clicks"  Functional Limitation: Mobility: Walking and moving around Mobility: Walking and Moving Around Current Status [phone]): At least 40 percent but less than 60 percent impaired, limited or restricted    Time: 0855-0920 PT Time Calculation (min) (ACUTE ONLY): 25 min   Charges:         PT G Codes:   PT G-Codes **NOT FOR INPATIENT CLASS** Functional Assessment Tool Used: [employer] "6-clicks"  Functional Limitation:  Mobility: Walking and moving around Mobility: Walking and Moving Around Current Status [phone]): At least 40 percent but less than 60 percent impaired, limited or restricted    Beth Yexalen Deike, PT, DPT X: [phone]

## 2016-01-13 NOTE — Care Management Important Message (Signed)
Important Message  Patient Details  Name: Traci Martin MRN: [id number] Date of Birth: 1924/11/12   Medicare Important Message Given:  Yes    Malcolm MetroChildress, Niketa Turner Demske, RN 01/13/2016, 3:04 PM

## 2016-01-13 NOTE — Progress Notes (Signed)
Speech Language Pathology Treatment: Dysphagia  Patient Details Name: Traci Martin MRN: [id number] DOB: April 06, 1924 Today's Date: 01/13/2016 Time: [id number] SLP Time Calculation (min) (ACUTE ONLY): 31 min  Assessment / Plan / Recommendation Clinical Impression  Pt seen at bedside for ongoing diagnostic dysphagia intervention. She was seen while sitting upright in recliner and appeared visibly short of breath and eyes watery (prior to po administration). Oral care completed. Pt appears to have "hairy tongue" and SLP used toothbrush to softly brush dorsal surface on tongue. Pt reports no discomfort. Pt tolerated single ice chips over 5 presentations. When assessed with cup and straw sips thin liquid, Pt with a single episode of strong, immediate coughing (prolonged). Pt also assessed with straw sips of nectar. No overt coughing or throat clearing, however shortness of breath persisted (present before po administration). Recommend continuing diet as ordered (D3/mech soft with NTL) when pt is alert and upright and continue oral hygiene with brush to tongue. Will aim to complete MBSS tomorrow to see if there is a significant difference with thin and thickened liquids. Above to RN.    HPI HPI: Traci MainlandLena M Goh is a 82 y.o. female with medical history significant for cerebrals palsy, CHF, HLD, HTN, hx of dysphagia and atrial fibrillation not on any anticoagulation due to recurrent falls, presented by EMS with an episode of "choking". Patient states she has never had any trouble with swallowing. After her episode she began to have chest pain. She denies any further chest pain, shortness of breath, fever, or chills. Patient is generally non ambulatory.  She was given food in the ER without having any choking problem. While in the ED, her chest xray revealed possible atelectasis vs pneumonia, and despite no hx of coughs, fever, chills, or leukocytosis, and she was started on IV Levaquin by the EDP.  Hospitalist  was asked to admit the patient for CAP. ST referral  d/t concerns of aspiration.Marland Kitchen.      SLP Plan  Continue with current plan of care;MBS (consider MBS Thurs AM)     Recommendations  Diet recommendations: Dysphagia 3 (mechanical soft);Nectar-thick liquid Liquids provided via: Cup;Straw Medication Administration: Whole meds with puree Supervision: Staff to assist with self feeding;Full supervision/cueing for compensatory strategies Compensations: Slow rate;Small sips/bites;Lingual sweep for clearance of pocketing Postural Changes and/or Swallow Maneuvers: Out of bed for meals;Seated upright 90 degrees;Upright 30-60 min after meal                Oral Care Recommendations: Oral care BID Follow up Recommendations: 24 hour supervision/assistance Plan: Continue with current plan of care;MBS (consider MBS Thurs AM)       Thank you,  Havery MorosDabney Porter, CCC-SLP [phone]                PORTER,DABNEY 01/13/2016, 2:43 PM

## 2016-01-13 NOTE — Clinical Social Work Note (Signed)
Clinical Social Work Assessment  Patient Details  Name: Traci Martin MRN: [id number] Date of Birth: 1924-11-30  Date of referral:  01/13/16               Reason for consult:  Discharge Planning                Permission sought to share information with:    Permission granted to share information::     Name::        Agency::     Relationship::     Contact Information:  Traci Martin, neice, [place] on chart.  Housing/Transportation Living arrangements for the past 2 months:  Apartment Source of Information:  Other (Comment Required) Traci Martin(Traci Hedwig MortonKay Boom.) Patient Interpreter Needed:  None Criminal Activity/Legal Involvement Pertinent to Current Situation/Hospitalization:  No - Comment as needed Significant Relationships:  Other Family Members Lives with:  Self Do you feel safe going back to the place where you live?  Yes Need for family participation in patient care:  Yes (Comment)  Care giving concerns: Patient has nursing care. A nurse comes from 9-12 and from 3-6. Family checks on patient during the time there is no nurse in the home. One of patient's nurses' lives across the street and they check on patient after they leave.  The other nurse also checks on patient after she leaves.  Patient has life alert as well.    Social Worker assessment / plan:  Traci Martin stated that she feels that patient's health will decline if she goes in to placement. She stated that patient uses a wheelchair. CSW sent placement for ALF and SNF listing to Traci Martin via email ([email or web address]).  CSW signing off.   Employment status:  Retired Health and safety inspectornsurance information:  Armed forces operational officerMedicare, [place] Recommendations:  24 Hour Supervision Information / Referral to community resources:  Skilled Nursing Facility (ALF listing)  Patient/Family's Response to care:  Patient's family is not yet ready to place patient at this time.   Patient/Family's Understanding of and Emotional Response to  Diagnosis, Current Treatment, and Prognosis:  Family understands patient's diagnosos, treatment and prognosis.   Emotional Assessment Appearance:  Appears stated age Attitude/Demeanor/Rapport:   (Cooperative) Affect (typically observed):  Accepting Orientation:  Oriented to Self Alcohol / Substance use:  Not Applicable Psych involvement (Current and /or in the community):  No (Comment)  Discharge Needs  Concerns to be addressed:  Discharge Planning Concerns Readmission within the last 30 days:  No Current discharge risk:  Lives alone, Dependent with Mobility Barriers to Discharge:  No Barriers Identified   Traci Martin, Traci Arambula D, LCSW 01/13/2016, 9:56 AM

## 2016-01-14 MED ORDER — LEVOFLOXACIN 500 MG PO TABS: 500.0000 mg | ORAL_TABLET | Freq: Every day | ORAL | 0 refills | Status: DC

## 2016-01-14 NOTE — Progress Notes (Signed)
Patient with orders to be discharge home. Discharge instructions given, patient's niece/caregiver verbalized understanding. Patient stable. Patient left with portable oxygen with niece in private vehicle.

## 2016-01-14 NOTE — Progress Notes (Signed)
Subjective: She says she feels better. No new complaints. She is still coughing. No chest pain. No nausea or vomiting. Apparently she is going to go back home rather than placement. I think she is probably okay for discharge today.  Objective: Vital signs in last 24 hours: Temp:  [98.1 F (36.7 C)-98.6 F (37 C)] 98.2 F (36.8 C) (11/16 0454) Pulse Rate:  [71-96] 71 (11/16 0454) Resp:  [18] 18 (11/16 0454) BP: (142-147)/(61-80) 147/61 (11/16 0454) SpO2:  [94 %-100 %] 94 % (11/16 0751) Weight change:  Last BM Date: 01/11/16  Intake/Output from previous day: 11/15 0701 - 11/16 0700 In: 480 [P.O.:480] Out: -   PHYSICAL EXAM General appearance: alert, cooperative and no distress Resp: rhonchi bilaterally Cardio: regular rate and rhythm, S1, S2 normal, no murmur, click, rub or gallop GI: soft, non-tender; bowel sounds normal; no masses,  no organomegaly Extremities: extremities normal, atraumatic, no cyanosis or edema Chronic left hemiparesis from cerebral palsy. Skin warm and dry  Lab Results:  No results found for this or any previous visit (from the past 48 hour(s)).  ABGS No results for input(s): PHART, PO2ART, TCO2, HCO3 in the last 72 hours.  Invalid input(s): PCO2 CULTURES No results found for this or any previous visit (from the past 240 hour(s)). Studies/Results: No results found.  Medications:  Prior to Admission:  Prescriptions Prior to Admission  Medication Sig Dispense Refill Last Dose  . albuterol (PROVENTIL HFA;VENTOLIN HFA) 108 (90 BASE) MCG/ACT inhaler Inhale 1-2 puffs into the lungs every 6 (six) hours as needed for wheezing. 1 Inhaler 0 unknown  . aspirin EC 81 MG tablet Take 1 tablet (81 mg total) by mouth daily. 30 tablet 12 01/09/2016 at Unknown time  . cloNIDine (CATAPRES) 0.1 MG tablet Take 0.1 mg by mouth daily.   01/09/2016 at Unknown time  . diltiazem (DILACOR XR) 180 MG 24 hr capsule Take 180 mg by mouth daily.   01/09/2016 at Unknown time  .  ezetimibe (ZETIA) 10 MG tablet Take 5 mg by mouth daily.   01/09/2016 at Unknown time  . furosemide (LASIX) 40 MG tablet Take 40 mg by mouth daily.   01/09/2016 at Unknown time  . pantoprazole (PROTONIX) 40 MG tablet TAKE ONE TABLET BY MOUTH DAILY. 30 tablet 11 01/09/2016 at Unknown time  . potassium chloride SA (K-DUR,KLOR-CON) 20 MEQ tablet Take 20 mEq by mouth 2 (two) times daily.   01/09/2016 at Unknown time  . SYMBICORT 160-4.5 MCG/ACT inhaler Inhale 2 puffs into the lungs 2 (two) times daily.    01/09/2016 at Unknown time  . ipratropium-albuterol (DUONEB) 0.5-2.5 (3) MG/3ML SOLN Take 3 mLs by nebulization every 4 (four) hours as needed (shortness of breath).   unknown   Scheduled: . aspirin EC  81 mg Oral Daily  . cloNIDine  0.1 mg Oral Daily  . diltiazem  180 mg Oral Daily  . ezetimibe  5 mg Oral Daily  . heparin  5,000 Units Subcutaneous Q8H  . levofloxacin (LEVAQUIN) IV  750 mg Intravenous Q48H  . mometasone-formoterol  2 puff Inhalation BID  . pantoprazole  40 mg Oral Daily  . potassium chloride SA  20 mEq Oral BID   Continuous: . sodium chloride 75 mL/hr at 01/13/16 0518   WUJ:[id number]:acetaminophen **OR** acetaminophen, albuterol, ipratropium-albuterol  Assesment:She was admitted with community-acquired pneumonia. She has dysphagia. She is better. Her lungs are much clearer. She has history of atrial fib but seems to be in sinus right now. She has cerebral palsy  at baseline. Principal Problem:   CAP (community acquired pneumonia) Active Problems:   Atrial fibrillation (HCC)   Hypertension   Cerebral palsy (HCC)   Dysphagia, pharyngoesophageal phase    Plan: I think she can discharge today    LOS: 3 days   Terrilyn Tyner L 01/14/2016, 8:20 AM

## 2016-01-14 NOTE — Progress Notes (Signed)
SATURATION QUALIFICATIONS: (This note is used to comply with regulatory documentation for home oxygen)  Patient Saturations on Room Air at Rest = 83%  Patient unable to ambulate  Patient Saturations on 2 Liters of oxygen 94%  Please briefly explain why patient needs home oxygen: Patient saturations drop when on room air.

## 2016-01-14 NOTE — Discharge Summary (Signed)
Physician Discharge Summary  Patient ID: Traci Martin MRN: [id number] DOB/AGE: 1924/09/14 82 y.o. Primary Care Physician:Linzey Ramser L, MD Admit date: 01/10/2016 Discharge date: 01/14/2016    Discharge Diagnoses:   Principal Problem:   CAP (community acquired pneumonia) Active Problems:   Atrial fibrillation (HCC)   Hypertension   Cerebral palsy (HCC)   Dysphagia, pharyngoesophageal phase     Medication List    TAKE these medications   albuterol 108 (90 Base) MCG/ACT inhaler Commonly known as:  PROVENTIL HFA;VENTOLIN HFA Inhale 1-2 puffs into the lungs every 6 (six) hours as needed for wheezing.   aspirin EC 81 MG tablet Take 1 tablet (81 mg total) by mouth daily.   cloNIDine 0.1 MG tablet Commonly known as:  CATAPRES Take 0.1 mg by mouth daily.   diltiazem 180 MG 24 hr capsule Commonly known as:  DILACOR XR Take 180 mg by mouth daily.   ezetimibe 10 MG tablet Commonly known as:  ZETIA Take 5 mg by mouth daily.   furosemide 40 MG tablet Commonly known as:  LASIX Take 40 mg by mouth daily.   ipratropium-albuterol 0.5-2.5 (3) MG/3ML Soln Commonly known as:  DUONEB Take 3 mLs by nebulization every 4 (four) hours as needed (shortness of breath).   levofloxacin 500 MG tablet Commonly known as:  LEVAQUIN Take 1 tablet (500 mg total) by mouth daily.   pantoprazole 40 MG tablet Commonly known as:  PROTONIX TAKE ONE TABLET BY MOUTH DAILY.   potassium chloride SA 20 MEQ tablet Commonly known as:  K-DUR,KLOR-CON Take 20 mEq by mouth 2 (two) times daily.   SYMBICORT 160-4.5 MCG/ACT inhaler Generic drug:  budesonide-formoterol Inhale 2 puffs into the lungs 2 (two) times daily.            Durable Medical Equipment        Start     Ordered   01/14/16 0000  For home use only DME oxygen    Question Answer Comment  Mode or (Route) Nasal cannula   Liters per Minute 3   Frequency Continuous (stationary and portable oxygen unit needed)   Oxygen  conserving device No   Oxygen delivery system Gas      01/14/16 0852      Discharged Condition: Improved    Consults: None  Significant Diagnostic Studies: Dg Chest 1 View  Result Date: 01/10/2016 CLINICAL DATA:  Chest pain EXAM: CHEST 1 VIEW COMPARISON:  12/05/2012 FINDINGS: There is chronic elevation of the left diaphragm. There is left lower lobe airspace disease. There is bilateral interstitial thickening. There is no pleural effusion or pneumothorax. There is stable cardiomegaly. The osseous structures are unremarkable. IMPRESSION: Cardiomegaly with pulmonary vascular congestion. Left lower lobe airspace disease which may reflect atelectasis versus pneumonia. Electronically Signed   By: Elige KoHetal  Patel   On: 01/10/2016 17:34    Lab Results: Basic Metabolic Panel: No results for input(s): NA, K, CL, CO2, GLUCOSE, BUN, CREATININE, CALCIUM, MG, PHOS in the last 72 hours. Liver Function Tests: No results for input(s): AST, ALT, ALKPHOS, BILITOT, PROT, ALBUMIN in the last 72 hours.   CBC: No results for input(s): WBC, NEUTROABS, HGB, HCT, MCV, PLT in the last 72 hours.  No results found for this or any previous visit (from the past 240 hour(s)).   Hospital Course: This is an 82 year old who at baseline has cerebral palsy and who had increasing cough and congestion. She came to the emergency department where she was noted to have left lower lobe pneumonia. She  was treated for this and improved. She had speech evaluation because of concerns about her swallowing and she is at mild risk of aspiration. She was hypoxic and is going to require home oxygen at least temporarily. She is back at baseline except for her hypoxia at the time of discharge  Discharge Exam: Blood pressure (!) 147/61, pulse 71, temperature 98.2 F (36.8 C), temperature source Oral, resp. rate 18, height 5\' 2"  (1.575 m), weight 42.9 kg (94 lb 9.2 oz), SpO2 94 %. She is awake and alert. She has chronic left hemiparesis  from cerebral palsy  Disposition: Home with home health services 3 L of oxygen. She needs soft diet with chopped meats with aspiration precautions  Discharge Instructions    Discharge patient    Complete by:  As directed    Face-to-face encounter (required for Medicare/Medicaid patients)    Complete by:  As directed    I Revis Whalin L certify that this patient is under my care and that I, or a nurse practitioner or physician's assistant working with me, had a face-to-face encounter that meets the physician face-to-face encounter requirements with this patient on 01/14/2016. The encounter with the patient was in whole, or in part for the following medical condition(s) which is the primary reason for home health care (List medical condition): Pneumonia   The encounter with the patient was in whole, or in part, for the following medical condition, which is the primary reason for home health care:  Pneumonia   I certify that, based on my findings, the following services are medically necessary home health services:   Nursing Physical therapy     Reason for Medically Necessary Home Health Services:  Skilled Nursing- Change/Decline in Patient Status   My clinical findings support the need for the above services:  Unable to leave home safely without assistance and/or assistive device   Further, I certify that my clinical findings support that this patient is homebound due to:  Unable to leave home safely without assistance   For home use only DME oxygen    Complete by:  As directed    Mode or (Route):  Nasal cannula   Liters per Minute:  3   Frequency:  Continuous (stationary and portable oxygen unit needed)   Oxygen conserving device:  No   Oxygen delivery system:  Gas   Home Health    Complete by:  As directed    To provide the following care/treatments:   PT RN Social work          Signed: Eivan Gallina L   01/14/2016, 8:53 AM

## 2016-01-18 NOTE — Discharge Summary (Signed)
NAME:  Traci Martin, Traci Martin               ACCOUNT NO.:  [id number]  MEDICAL RECORD NO.:  [id number]  LOCATION:  A315                          FACILITY:  [facility]  PHYSICIAN:  Ikran Patman L. Juanetta GoslingHawkins, M.D.DATE OF BIRTH:  Jul 20, 1924  DATE OF ADMISSION:  01/10/2016 DATE OF DISCHARGE:  11/16/2017LH                              DISCHARGE SUMMARY   ADDENDUM:  DISCHARGE DIAGNOSES:  Aspiration pneumonia and severe malnutrition.     Lief Palmatier L. Juanetta GoslingHawkins, M.D.     EL[facility]/MEDQ  D:  01/17/2016  T:  01/18/2016  Job:  [id number]

## 2016-02-29 DIAGNOSIS — J189 Pneumonia, unspecified organism: Secondary | ICD-10-CM | POA: Diagnosis not present

## 2016-02-29 DIAGNOSIS — I4891 Unspecified atrial fibrillation: Secondary | ICD-10-CM | POA: Diagnosis not present

## 2016-02-29 DIAGNOSIS — G809 Cerebral palsy, unspecified: Secondary | ICD-10-CM | POA: Diagnosis not present

## 2016-02-29 DIAGNOSIS — J188 Other pneumonia, unspecified organism: Secondary | ICD-10-CM | POA: Diagnosis not present

## 2016-02-29 DIAGNOSIS — G3184 Mild cognitive impairment, so stated: Secondary | ICD-10-CM | POA: Diagnosis not present

## 2016-02-29 DIAGNOSIS — R110 Nausea: Secondary | ICD-10-CM | POA: Diagnosis not present

## 2016-02-29 DIAGNOSIS — R1314 Dysphagia, pharyngoesophageal phase: Secondary | ICD-10-CM | POA: Diagnosis not present

## 2016-02-29 DIAGNOSIS — R1310 Dysphagia, unspecified: Secondary | ICD-10-CM | POA: Diagnosis not present

## 2016-02-29 DIAGNOSIS — Z79899 Other long term (current) drug therapy: Secondary | ICD-10-CM | POA: Diagnosis not present

## 2016-02-29 DIAGNOSIS — M6281 Muscle weakness (generalized): Secondary | ICD-10-CM | POA: Diagnosis not present

## 2016-02-29 DIAGNOSIS — K5900 Constipation, unspecified: Secondary | ICD-10-CM | POA: Diagnosis not present

## 2016-02-29 DIAGNOSIS — I10 Essential (primary) hypertension: Secondary | ICD-10-CM | POA: Diagnosis not present

## 2016-03-17 DIAGNOSIS — G3184 Mild cognitive impairment, so stated: Secondary | ICD-10-CM | POA: Diagnosis not present

## 2016-03-17 DIAGNOSIS — R110 Nausea: Secondary | ICD-10-CM | POA: Diagnosis not present

## 2016-03-17 DIAGNOSIS — G809 Cerebral palsy, unspecified: Secondary | ICD-10-CM | POA: Diagnosis not present

## 2016-03-17 DIAGNOSIS — K5900 Constipation, unspecified: Secondary | ICD-10-CM | POA: Diagnosis not present

## 2016-03-21 DIAGNOSIS — J188 Other pneumonia, unspecified organism: Secondary | ICD-10-CM | POA: Diagnosis not present

## 2016-03-21 DIAGNOSIS — G3184 Mild cognitive impairment, so stated: Secondary | ICD-10-CM | POA: Diagnosis not present

## 2016-03-21 DIAGNOSIS — J189 Pneumonia, unspecified organism: Secondary | ICD-10-CM | POA: Diagnosis not present

## 2016-03-21 DIAGNOSIS — R1310 Dysphagia, unspecified: Secondary | ICD-10-CM | POA: Diagnosis not present

## 2016-03-21 DIAGNOSIS — M6281 Muscle weakness (generalized): Secondary | ICD-10-CM | POA: Diagnosis not present

## 2016-03-22 DIAGNOSIS — R1310 Dysphagia, unspecified: Secondary | ICD-10-CM | POA: Diagnosis not present

## 2016-03-22 DIAGNOSIS — G3184 Mild cognitive impairment, so stated: Secondary | ICD-10-CM | POA: Diagnosis not present

## 2016-03-22 DIAGNOSIS — J188 Other pneumonia, unspecified organism: Secondary | ICD-10-CM | POA: Diagnosis not present

## 2016-03-22 DIAGNOSIS — J189 Pneumonia, unspecified organism: Secondary | ICD-10-CM | POA: Diagnosis not present

## 2016-03-22 DIAGNOSIS — M6281 Muscle weakness (generalized): Secondary | ICD-10-CM | POA: Diagnosis not present

## 2016-03-23 DIAGNOSIS — M6281 Muscle weakness (generalized): Secondary | ICD-10-CM | POA: Diagnosis not present

## 2016-03-23 DIAGNOSIS — J189 Pneumonia, unspecified organism: Secondary | ICD-10-CM | POA: Diagnosis not present

## 2016-03-23 DIAGNOSIS — G3184 Mild cognitive impairment, so stated: Secondary | ICD-10-CM | POA: Diagnosis not present

## 2016-03-23 DIAGNOSIS — J188 Other pneumonia, unspecified organism: Secondary | ICD-10-CM | POA: Diagnosis not present

## 2016-03-23 DIAGNOSIS — R1310 Dysphagia, unspecified: Secondary | ICD-10-CM | POA: Diagnosis not present

## 2016-03-24 DIAGNOSIS — I4891 Unspecified atrial fibrillation: Secondary | ICD-10-CM | POA: Diagnosis not present

## 2016-03-24 DIAGNOSIS — J189 Pneumonia, unspecified organism: Secondary | ICD-10-CM | POA: Diagnosis not present

## 2016-03-24 DIAGNOSIS — G3184 Mild cognitive impairment, so stated: Secondary | ICD-10-CM | POA: Diagnosis not present

## 2016-03-24 DIAGNOSIS — J449 Chronic obstructive pulmonary disease, unspecified: Secondary | ICD-10-CM | POA: Diagnosis not present

## 2016-03-24 DIAGNOSIS — K5900 Constipation, unspecified: Secondary | ICD-10-CM | POA: Diagnosis not present

## 2016-03-24 DIAGNOSIS — R110 Nausea: Secondary | ICD-10-CM | POA: Diagnosis not present

## 2016-03-24 DIAGNOSIS — R1310 Dysphagia, unspecified: Secondary | ICD-10-CM | POA: Diagnosis not present

## 2016-03-24 DIAGNOSIS — M6281 Muscle weakness (generalized): Secondary | ICD-10-CM | POA: Diagnosis not present

## 2016-03-24 DIAGNOSIS — J188 Other pneumonia, unspecified organism: Secondary | ICD-10-CM | POA: Diagnosis not present

## 2016-03-25 DIAGNOSIS — J188 Other pneumonia, unspecified organism: Secondary | ICD-10-CM | POA: Diagnosis not present

## 2016-03-25 DIAGNOSIS — M6281 Muscle weakness (generalized): Secondary | ICD-10-CM | POA: Diagnosis not present

## 2016-03-25 DIAGNOSIS — G3184 Mild cognitive impairment, so stated: Secondary | ICD-10-CM | POA: Diagnosis not present

## 2016-03-25 DIAGNOSIS — R1310 Dysphagia, unspecified: Secondary | ICD-10-CM | POA: Diagnosis not present

## 2016-03-25 DIAGNOSIS — J189 Pneumonia, unspecified organism: Secondary | ICD-10-CM | POA: Diagnosis not present

## 2016-03-28 DIAGNOSIS — J189 Pneumonia, unspecified organism: Secondary | ICD-10-CM | POA: Diagnosis not present

## 2016-03-28 DIAGNOSIS — G3184 Mild cognitive impairment, so stated: Secondary | ICD-10-CM | POA: Diagnosis not present

## 2016-03-28 DIAGNOSIS — J188 Other pneumonia, unspecified organism: Secondary | ICD-10-CM | POA: Diagnosis not present

## 2016-03-28 DIAGNOSIS — R1310 Dysphagia, unspecified: Secondary | ICD-10-CM | POA: Diagnosis not present

## 2016-03-28 DIAGNOSIS — M6281 Muscle weakness (generalized): Secondary | ICD-10-CM | POA: Diagnosis not present

## 2016-03-29 DIAGNOSIS — R1310 Dysphagia, unspecified: Secondary | ICD-10-CM | POA: Diagnosis not present

## 2016-03-29 DIAGNOSIS — M6281 Muscle weakness (generalized): Secondary | ICD-10-CM | POA: Diagnosis not present

## 2016-03-29 DIAGNOSIS — G3184 Mild cognitive impairment, so stated: Secondary | ICD-10-CM | POA: Diagnosis not present

## 2016-03-29 DIAGNOSIS — J189 Pneumonia, unspecified organism: Secondary | ICD-10-CM | POA: Diagnosis not present

## 2016-03-29 DIAGNOSIS — J188 Other pneumonia, unspecified organism: Secondary | ICD-10-CM | POA: Diagnosis not present

## 2016-03-30 DIAGNOSIS — J188 Other pneumonia, unspecified organism: Secondary | ICD-10-CM | POA: Diagnosis not present

## 2016-03-30 DIAGNOSIS — G3184 Mild cognitive impairment, so stated: Secondary | ICD-10-CM | POA: Diagnosis not present

## 2016-03-30 DIAGNOSIS — R1310 Dysphagia, unspecified: Secondary | ICD-10-CM | POA: Diagnosis not present

## 2016-03-30 DIAGNOSIS — M6281 Muscle weakness (generalized): Secondary | ICD-10-CM | POA: Diagnosis not present

## 2016-03-30 DIAGNOSIS — J189 Pneumonia, unspecified organism: Secondary | ICD-10-CM | POA: Diagnosis not present

## 2016-03-31 DIAGNOSIS — M6281 Muscle weakness (generalized): Secondary | ICD-10-CM | POA: Diagnosis not present

## 2016-03-31 DIAGNOSIS — R1310 Dysphagia, unspecified: Secondary | ICD-10-CM | POA: Diagnosis not present

## 2016-03-31 DIAGNOSIS — J189 Pneumonia, unspecified organism: Secondary | ICD-10-CM | POA: Diagnosis not present

## 2016-03-31 DIAGNOSIS — G3184 Mild cognitive impairment, so stated: Secondary | ICD-10-CM | POA: Diagnosis not present

## 2016-03-31 DIAGNOSIS — J188 Other pneumonia, unspecified organism: Secondary | ICD-10-CM | POA: Diagnosis not present

## 2016-04-01 DIAGNOSIS — G3184 Mild cognitive impairment, so stated: Secondary | ICD-10-CM | POA: Diagnosis not present

## 2016-04-01 DIAGNOSIS — R1310 Dysphagia, unspecified: Secondary | ICD-10-CM | POA: Diagnosis not present

## 2016-04-01 DIAGNOSIS — M6281 Muscle weakness (generalized): Secondary | ICD-10-CM | POA: Diagnosis not present

## 2016-04-01 DIAGNOSIS — J189 Pneumonia, unspecified organism: Secondary | ICD-10-CM | POA: Diagnosis not present

## 2016-04-01 DIAGNOSIS — J188 Other pneumonia, unspecified organism: Secondary | ICD-10-CM | POA: Diagnosis not present

## 2016-04-04 DIAGNOSIS — M6281 Muscle weakness (generalized): Secondary | ICD-10-CM | POA: Diagnosis not present

## 2016-04-04 DIAGNOSIS — J188 Other pneumonia, unspecified organism: Secondary | ICD-10-CM | POA: Diagnosis not present

## 2016-04-04 DIAGNOSIS — R1310 Dysphagia, unspecified: Secondary | ICD-10-CM | POA: Diagnosis not present

## 2016-04-04 DIAGNOSIS — G3184 Mild cognitive impairment, so stated: Secondary | ICD-10-CM | POA: Diagnosis not present

## 2016-04-04 DIAGNOSIS — J189 Pneumonia, unspecified organism: Secondary | ICD-10-CM | POA: Diagnosis not present

## 2016-04-05 DIAGNOSIS — G3184 Mild cognitive impairment, so stated: Secondary | ICD-10-CM | POA: Diagnosis not present

## 2016-04-05 DIAGNOSIS — J188 Other pneumonia, unspecified organism: Secondary | ICD-10-CM | POA: Diagnosis not present

## 2016-04-05 DIAGNOSIS — J189 Pneumonia, unspecified organism: Secondary | ICD-10-CM | POA: Diagnosis not present

## 2016-04-05 DIAGNOSIS — R1310 Dysphagia, unspecified: Secondary | ICD-10-CM | POA: Diagnosis not present

## 2016-04-05 DIAGNOSIS — M6281 Muscle weakness (generalized): Secondary | ICD-10-CM | POA: Diagnosis not present

## 2016-04-06 DIAGNOSIS — J189 Pneumonia, unspecified organism: Secondary | ICD-10-CM | POA: Diagnosis not present

## 2016-04-06 DIAGNOSIS — M6281 Muscle weakness (generalized): Secondary | ICD-10-CM | POA: Diagnosis not present

## 2016-04-06 DIAGNOSIS — J188 Other pneumonia, unspecified organism: Secondary | ICD-10-CM | POA: Diagnosis not present

## 2016-04-06 DIAGNOSIS — R1310 Dysphagia, unspecified: Secondary | ICD-10-CM | POA: Diagnosis not present

## 2016-04-06 DIAGNOSIS — G3184 Mild cognitive impairment, so stated: Secondary | ICD-10-CM | POA: Diagnosis not present

## 2016-04-07 DIAGNOSIS — J188 Other pneumonia, unspecified organism: Secondary | ICD-10-CM | POA: Diagnosis not present

## 2016-04-07 DIAGNOSIS — G3184 Mild cognitive impairment, so stated: Secondary | ICD-10-CM | POA: Diagnosis not present

## 2016-04-07 DIAGNOSIS — J189 Pneumonia, unspecified organism: Secondary | ICD-10-CM | POA: Diagnosis not present

## 2016-04-07 DIAGNOSIS — R1310 Dysphagia, unspecified: Secondary | ICD-10-CM | POA: Diagnosis not present

## 2016-04-07 DIAGNOSIS — M6281 Muscle weakness (generalized): Secondary | ICD-10-CM | POA: Diagnosis not present

## 2016-04-08 DIAGNOSIS — G3184 Mild cognitive impairment, so stated: Secondary | ICD-10-CM | POA: Diagnosis not present

## 2016-04-08 DIAGNOSIS — J188 Other pneumonia, unspecified organism: Secondary | ICD-10-CM | POA: Diagnosis not present

## 2016-04-08 DIAGNOSIS — M6281 Muscle weakness (generalized): Secondary | ICD-10-CM | POA: Diagnosis not present

## 2016-04-08 DIAGNOSIS — R1310 Dysphagia, unspecified: Secondary | ICD-10-CM | POA: Diagnosis not present

## 2016-04-08 DIAGNOSIS — J189 Pneumonia, unspecified organism: Secondary | ICD-10-CM | POA: Diagnosis not present

## 2016-04-09 DIAGNOSIS — G3184 Mild cognitive impairment, so stated: Secondary | ICD-10-CM | POA: Diagnosis not present

## 2016-04-09 DIAGNOSIS — M6281 Muscle weakness (generalized): Secondary | ICD-10-CM | POA: Diagnosis not present

## 2016-04-09 DIAGNOSIS — R1310 Dysphagia, unspecified: Secondary | ICD-10-CM | POA: Diagnosis not present

## 2016-04-09 DIAGNOSIS — J188 Other pneumonia, unspecified organism: Secondary | ICD-10-CM | POA: Diagnosis not present

## 2016-04-09 DIAGNOSIS — J189 Pneumonia, unspecified organism: Secondary | ICD-10-CM | POA: Diagnosis not present

## 2016-04-11 DIAGNOSIS — J188 Other pneumonia, unspecified organism: Secondary | ICD-10-CM | POA: Diagnosis not present

## 2016-04-11 DIAGNOSIS — M6281 Muscle weakness (generalized): Secondary | ICD-10-CM | POA: Diagnosis not present

## 2016-04-11 DIAGNOSIS — G3184 Mild cognitive impairment, so stated: Secondary | ICD-10-CM | POA: Diagnosis not present

## 2016-04-11 DIAGNOSIS — R1310 Dysphagia, unspecified: Secondary | ICD-10-CM | POA: Diagnosis not present

## 2016-04-11 DIAGNOSIS — J189 Pneumonia, unspecified organism: Secondary | ICD-10-CM | POA: Diagnosis not present

## 2016-04-12 DIAGNOSIS — G3184 Mild cognitive impairment, so stated: Secondary | ICD-10-CM | POA: Diagnosis not present

## 2016-04-12 DIAGNOSIS — J189 Pneumonia, unspecified organism: Secondary | ICD-10-CM | POA: Diagnosis not present

## 2016-04-12 DIAGNOSIS — J188 Other pneumonia, unspecified organism: Secondary | ICD-10-CM | POA: Diagnosis not present

## 2016-04-12 DIAGNOSIS — M6281 Muscle weakness (generalized): Secondary | ICD-10-CM | POA: Diagnosis not present

## 2016-04-12 DIAGNOSIS — R1310 Dysphagia, unspecified: Secondary | ICD-10-CM | POA: Diagnosis not present

## 2016-04-13 DIAGNOSIS — J188 Other pneumonia, unspecified organism: Secondary | ICD-10-CM | POA: Diagnosis not present

## 2016-04-13 DIAGNOSIS — R1310 Dysphagia, unspecified: Secondary | ICD-10-CM | POA: Diagnosis not present

## 2016-04-13 DIAGNOSIS — J189 Pneumonia, unspecified organism: Secondary | ICD-10-CM | POA: Diagnosis not present

## 2016-04-13 DIAGNOSIS — M6281 Muscle weakness (generalized): Secondary | ICD-10-CM | POA: Diagnosis not present

## 2016-04-13 DIAGNOSIS — G3184 Mild cognitive impairment, so stated: Secondary | ICD-10-CM | POA: Diagnosis not present

## 2016-04-14 DIAGNOSIS — M6281 Muscle weakness (generalized): Secondary | ICD-10-CM | POA: Diagnosis not present

## 2016-04-14 DIAGNOSIS — J188 Other pneumonia, unspecified organism: Secondary | ICD-10-CM | POA: Diagnosis not present

## 2016-04-14 DIAGNOSIS — J189 Pneumonia, unspecified organism: Secondary | ICD-10-CM | POA: Diagnosis not present

## 2016-04-14 DIAGNOSIS — R1310 Dysphagia, unspecified: Secondary | ICD-10-CM | POA: Diagnosis not present

## 2016-04-14 DIAGNOSIS — G3184 Mild cognitive impairment, so stated: Secondary | ICD-10-CM | POA: Diagnosis not present

## 2016-04-15 DIAGNOSIS — J189 Pneumonia, unspecified organism: Secondary | ICD-10-CM | POA: Diagnosis not present

## 2016-04-15 DIAGNOSIS — M6281 Muscle weakness (generalized): Secondary | ICD-10-CM | POA: Diagnosis not present

## 2016-04-15 DIAGNOSIS — R1310 Dysphagia, unspecified: Secondary | ICD-10-CM | POA: Diagnosis not present

## 2016-04-15 DIAGNOSIS — G3184 Mild cognitive impairment, so stated: Secondary | ICD-10-CM | POA: Diagnosis not present

## 2016-04-15 DIAGNOSIS — J188 Other pneumonia, unspecified organism: Secondary | ICD-10-CM | POA: Diagnosis not present

## 2016-04-18 DIAGNOSIS — G3184 Mild cognitive impairment, so stated: Secondary | ICD-10-CM | POA: Diagnosis not present

## 2016-04-18 DIAGNOSIS — J188 Other pneumonia, unspecified organism: Secondary | ICD-10-CM | POA: Diagnosis not present

## 2016-04-18 DIAGNOSIS — M6281 Muscle weakness (generalized): Secondary | ICD-10-CM | POA: Diagnosis not present

## 2016-04-18 DIAGNOSIS — R1310 Dysphagia, unspecified: Secondary | ICD-10-CM | POA: Diagnosis not present

## 2016-04-18 DIAGNOSIS — J189 Pneumonia, unspecified organism: Secondary | ICD-10-CM | POA: Diagnosis not present

## 2016-04-19 DIAGNOSIS — J189 Pneumonia, unspecified organism: Secondary | ICD-10-CM | POA: Diagnosis not present

## 2016-04-19 DIAGNOSIS — M6281 Muscle weakness (generalized): Secondary | ICD-10-CM | POA: Diagnosis not present

## 2016-04-19 DIAGNOSIS — G3184 Mild cognitive impairment, so stated: Secondary | ICD-10-CM | POA: Diagnosis not present

## 2016-04-19 DIAGNOSIS — R1310 Dysphagia, unspecified: Secondary | ICD-10-CM | POA: Diagnosis not present

## 2016-04-19 DIAGNOSIS — J188 Other pneumonia, unspecified organism: Secondary | ICD-10-CM | POA: Diagnosis not present

## 2016-04-21 DIAGNOSIS — M6281 Muscle weakness (generalized): Secondary | ICD-10-CM | POA: Diagnosis not present

## 2016-04-21 DIAGNOSIS — G3184 Mild cognitive impairment, so stated: Secondary | ICD-10-CM | POA: Diagnosis not present

## 2016-04-21 DIAGNOSIS — J188 Other pneumonia, unspecified organism: Secondary | ICD-10-CM | POA: Diagnosis not present

## 2016-04-21 DIAGNOSIS — J189 Pneumonia, unspecified organism: Secondary | ICD-10-CM | POA: Diagnosis not present

## 2016-04-21 DIAGNOSIS — R1310 Dysphagia, unspecified: Secondary | ICD-10-CM | POA: Diagnosis not present

## 2016-04-25 DIAGNOSIS — M6281 Muscle weakness (generalized): Secondary | ICD-10-CM | POA: Diagnosis not present

## 2016-04-25 DIAGNOSIS — G3184 Mild cognitive impairment, so stated: Secondary | ICD-10-CM | POA: Diagnosis not present

## 2016-04-25 DIAGNOSIS — R1310 Dysphagia, unspecified: Secondary | ICD-10-CM | POA: Diagnosis not present

## 2016-04-25 DIAGNOSIS — J189 Pneumonia, unspecified organism: Secondary | ICD-10-CM | POA: Diagnosis not present

## 2016-04-25 DIAGNOSIS — J188 Other pneumonia, unspecified organism: Secondary | ICD-10-CM | POA: Diagnosis not present

## 2016-04-27 DIAGNOSIS — G3184 Mild cognitive impairment, so stated: Secondary | ICD-10-CM | POA: Diagnosis not present

## 2016-04-27 DIAGNOSIS — M6281 Muscle weakness (generalized): Secondary | ICD-10-CM | POA: Diagnosis not present

## 2016-04-27 DIAGNOSIS — J188 Other pneumonia, unspecified organism: Secondary | ICD-10-CM | POA: Diagnosis not present

## 2016-04-27 DIAGNOSIS — R1310 Dysphagia, unspecified: Secondary | ICD-10-CM | POA: Diagnosis not present

## 2016-04-27 DIAGNOSIS — J189 Pneumonia, unspecified organism: Secondary | ICD-10-CM | POA: Diagnosis not present

## 2016-05-02 DIAGNOSIS — K5900 Constipation, unspecified: Secondary | ICD-10-CM | POA: Diagnosis not present

## 2016-05-02 DIAGNOSIS — R1310 Dysphagia, unspecified: Secondary | ICD-10-CM | POA: Diagnosis not present

## 2016-05-02 DIAGNOSIS — M6281 Muscle weakness (generalized): Secondary | ICD-10-CM | POA: Diagnosis not present

## 2016-05-02 DIAGNOSIS — I10 Essential (primary) hypertension: Secondary | ICD-10-CM | POA: Diagnosis not present

## 2016-05-05 DIAGNOSIS — M6281 Muscle weakness (generalized): Secondary | ICD-10-CM | POA: Diagnosis not present

## 2016-05-05 DIAGNOSIS — R1314 Dysphagia, pharyngoesophageal phase: Secondary | ICD-10-CM | POA: Diagnosis not present

## 2016-05-05 DIAGNOSIS — J188 Other pneumonia, unspecified organism: Secondary | ICD-10-CM | POA: Diagnosis not present

## 2016-05-05 DIAGNOSIS — R1310 Dysphagia, unspecified: Secondary | ICD-10-CM | POA: Diagnosis not present

## 2016-05-05 DIAGNOSIS — G3184 Mild cognitive impairment, so stated: Secondary | ICD-10-CM | POA: Diagnosis not present

## 2016-05-05 DIAGNOSIS — I639 Cerebral infarction, unspecified: Secondary | ICD-10-CM | POA: Diagnosis not present

## 2016-05-09 DIAGNOSIS — M6281 Muscle weakness (generalized): Secondary | ICD-10-CM | POA: Diagnosis not present

## 2016-05-09 DIAGNOSIS — R1314 Dysphagia, pharyngoesophageal phase: Secondary | ICD-10-CM | POA: Diagnosis not present

## 2016-05-09 DIAGNOSIS — J188 Other pneumonia, unspecified organism: Secondary | ICD-10-CM | POA: Diagnosis not present

## 2016-05-09 DIAGNOSIS — I639 Cerebral infarction, unspecified: Secondary | ICD-10-CM | POA: Diagnosis not present

## 2016-05-09 DIAGNOSIS — R1310 Dysphagia, unspecified: Secondary | ICD-10-CM | POA: Diagnosis not present

## 2016-05-09 DIAGNOSIS — G3184 Mild cognitive impairment, so stated: Secondary | ICD-10-CM | POA: Diagnosis not present

## 2016-05-12 DIAGNOSIS — R1314 Dysphagia, pharyngoesophageal phase: Secondary | ICD-10-CM | POA: Diagnosis not present

## 2016-05-12 DIAGNOSIS — J188 Other pneumonia, unspecified organism: Secondary | ICD-10-CM | POA: Diagnosis not present

## 2016-05-12 DIAGNOSIS — R1310 Dysphagia, unspecified: Secondary | ICD-10-CM | POA: Diagnosis not present

## 2016-05-12 DIAGNOSIS — G3184 Mild cognitive impairment, so stated: Secondary | ICD-10-CM | POA: Diagnosis not present

## 2016-05-12 DIAGNOSIS — I639 Cerebral infarction, unspecified: Secondary | ICD-10-CM | POA: Diagnosis not present

## 2016-05-12 DIAGNOSIS — M6281 Muscle weakness (generalized): Secondary | ICD-10-CM | POA: Diagnosis not present

## 2016-05-13 DIAGNOSIS — M6281 Muscle weakness (generalized): Secondary | ICD-10-CM | POA: Diagnosis not present

## 2016-05-13 DIAGNOSIS — R1314 Dysphagia, pharyngoesophageal phase: Secondary | ICD-10-CM | POA: Diagnosis not present

## 2016-05-13 DIAGNOSIS — I639 Cerebral infarction, unspecified: Secondary | ICD-10-CM | POA: Diagnosis not present

## 2016-05-13 DIAGNOSIS — R1310 Dysphagia, unspecified: Secondary | ICD-10-CM | POA: Diagnosis not present

## 2016-05-13 DIAGNOSIS — J188 Other pneumonia, unspecified organism: Secondary | ICD-10-CM | POA: Diagnosis not present

## 2016-05-13 DIAGNOSIS — G3184 Mild cognitive impairment, so stated: Secondary | ICD-10-CM | POA: Diagnosis not present

## 2016-05-16 DIAGNOSIS — R1310 Dysphagia, unspecified: Secondary | ICD-10-CM | POA: Diagnosis not present

## 2016-05-16 DIAGNOSIS — R1314 Dysphagia, pharyngoesophageal phase: Secondary | ICD-10-CM | POA: Diagnosis not present

## 2016-05-16 DIAGNOSIS — J188 Other pneumonia, unspecified organism: Secondary | ICD-10-CM | POA: Diagnosis not present

## 2016-05-16 DIAGNOSIS — M6281 Muscle weakness (generalized): Secondary | ICD-10-CM | POA: Diagnosis not present

## 2016-05-16 DIAGNOSIS — G3184 Mild cognitive impairment, so stated: Secondary | ICD-10-CM | POA: Diagnosis not present

## 2016-05-16 DIAGNOSIS — I639 Cerebral infarction, unspecified: Secondary | ICD-10-CM | POA: Diagnosis not present

## 2016-05-17 DIAGNOSIS — I639 Cerebral infarction, unspecified: Secondary | ICD-10-CM | POA: Diagnosis not present

## 2016-05-17 DIAGNOSIS — J188 Other pneumonia, unspecified organism: Secondary | ICD-10-CM | POA: Diagnosis not present

## 2016-05-17 DIAGNOSIS — R1314 Dysphagia, pharyngoesophageal phase: Secondary | ICD-10-CM | POA: Diagnosis not present

## 2016-05-17 DIAGNOSIS — M6281 Muscle weakness (generalized): Secondary | ICD-10-CM | POA: Diagnosis not present

## 2016-05-17 DIAGNOSIS — R1310 Dysphagia, unspecified: Secondary | ICD-10-CM | POA: Diagnosis not present

## 2016-05-17 DIAGNOSIS — G3184 Mild cognitive impairment, so stated: Secondary | ICD-10-CM | POA: Diagnosis not present

## 2016-05-18 DIAGNOSIS — R1314 Dysphagia, pharyngoesophageal phase: Secondary | ICD-10-CM | POA: Diagnosis not present

## 2016-05-18 DIAGNOSIS — M6281 Muscle weakness (generalized): Secondary | ICD-10-CM | POA: Diagnosis not present

## 2016-05-18 DIAGNOSIS — J188 Other pneumonia, unspecified organism: Secondary | ICD-10-CM | POA: Diagnosis not present

## 2016-05-18 DIAGNOSIS — G3184 Mild cognitive impairment, so stated: Secondary | ICD-10-CM | POA: Diagnosis not present

## 2016-05-18 DIAGNOSIS — I639 Cerebral infarction, unspecified: Secondary | ICD-10-CM | POA: Diagnosis not present

## 2016-05-18 DIAGNOSIS — R1310 Dysphagia, unspecified: Secondary | ICD-10-CM | POA: Diagnosis not present

## 2016-05-23 DIAGNOSIS — R1314 Dysphagia, pharyngoesophageal phase: Secondary | ICD-10-CM | POA: Diagnosis not present

## 2016-05-23 DIAGNOSIS — I639 Cerebral infarction, unspecified: Secondary | ICD-10-CM | POA: Diagnosis not present

## 2016-05-23 DIAGNOSIS — M6281 Muscle weakness (generalized): Secondary | ICD-10-CM | POA: Diagnosis not present

## 2016-05-23 DIAGNOSIS — G3184 Mild cognitive impairment, so stated: Secondary | ICD-10-CM | POA: Diagnosis not present

## 2016-05-23 DIAGNOSIS — J188 Other pneumonia, unspecified organism: Secondary | ICD-10-CM | POA: Diagnosis not present

## 2016-05-23 DIAGNOSIS — R1310 Dysphagia, unspecified: Secondary | ICD-10-CM | POA: Diagnosis not present

## 2016-05-24 DIAGNOSIS — R1310 Dysphagia, unspecified: Secondary | ICD-10-CM | POA: Diagnosis not present

## 2016-05-24 DIAGNOSIS — J188 Other pneumonia, unspecified organism: Secondary | ICD-10-CM | POA: Diagnosis not present

## 2016-05-24 DIAGNOSIS — R1314 Dysphagia, pharyngoesophageal phase: Secondary | ICD-10-CM | POA: Diagnosis not present

## 2016-05-24 DIAGNOSIS — G3184 Mild cognitive impairment, so stated: Secondary | ICD-10-CM | POA: Diagnosis not present

## 2016-05-24 DIAGNOSIS — M6281 Muscle weakness (generalized): Secondary | ICD-10-CM | POA: Diagnosis not present

## 2016-05-24 DIAGNOSIS — I639 Cerebral infarction, unspecified: Secondary | ICD-10-CM | POA: Diagnosis not present

## 2016-05-25 DIAGNOSIS — M6281 Muscle weakness (generalized): Secondary | ICD-10-CM | POA: Diagnosis not present

## 2016-05-25 DIAGNOSIS — G3184 Mild cognitive impairment, so stated: Secondary | ICD-10-CM | POA: Diagnosis not present

## 2016-05-25 DIAGNOSIS — R1310 Dysphagia, unspecified: Secondary | ICD-10-CM | POA: Diagnosis not present

## 2016-05-25 DIAGNOSIS — I639 Cerebral infarction, unspecified: Secondary | ICD-10-CM | POA: Diagnosis not present

## 2016-05-25 DIAGNOSIS — R1314 Dysphagia, pharyngoesophageal phase: Secondary | ICD-10-CM | POA: Diagnosis not present

## 2016-05-25 DIAGNOSIS — J188 Other pneumonia, unspecified organism: Secondary | ICD-10-CM | POA: Diagnosis not present

## 2016-05-26 DIAGNOSIS — M6281 Muscle weakness (generalized): Secondary | ICD-10-CM | POA: Diagnosis not present

## 2016-05-26 DIAGNOSIS — M810 Age-related osteoporosis without current pathological fracture: Secondary | ICD-10-CM | POA: Diagnosis not present

## 2016-05-26 DIAGNOSIS — E46 Unspecified protein-calorie malnutrition: Secondary | ICD-10-CM | POA: Diagnosis not present

## 2016-05-26 DIAGNOSIS — K5900 Constipation, unspecified: Secondary | ICD-10-CM | POA: Diagnosis not present

## 2016-06-07 DIAGNOSIS — J984 Other disorders of lung: Secondary | ICD-10-CM | POA: Diagnosis not present

## 2016-06-08 DIAGNOSIS — D649 Anemia, unspecified: Secondary | ICD-10-CM | POA: Diagnosis not present

## 2016-06-08 DIAGNOSIS — I10 Essential (primary) hypertension: Secondary | ICD-10-CM | POA: Diagnosis not present

## 2016-06-08 DIAGNOSIS — R0602 Shortness of breath: Secondary | ICD-10-CM | POA: Diagnosis not present

## 2016-06-09 DIAGNOSIS — J189 Pneumonia, unspecified organism: Secondary | ICD-10-CM | POA: Diagnosis not present

## 2016-06-09 DIAGNOSIS — R1310 Dysphagia, unspecified: Secondary | ICD-10-CM | POA: Diagnosis not present

## 2016-06-09 DIAGNOSIS — J449 Chronic obstructive pulmonary disease, unspecified: Secondary | ICD-10-CM | POA: Diagnosis not present

## 2016-06-09 DIAGNOSIS — R062 Wheezing: Secondary | ICD-10-CM | POA: Diagnosis not present

## 2016-06-23 DIAGNOSIS — R062 Wheezing: Secondary | ICD-10-CM | POA: Diagnosis not present

## 2016-06-23 DIAGNOSIS — J449 Chronic obstructive pulmonary disease, unspecified: Secondary | ICD-10-CM | POA: Diagnosis not present

## 2016-06-23 DIAGNOSIS — R1310 Dysphagia, unspecified: Secondary | ICD-10-CM | POA: Diagnosis not present

## 2016-06-23 DIAGNOSIS — J189 Pneumonia, unspecified organism: Secondary | ICD-10-CM | POA: Diagnosis not present

## 2016-06-27 DIAGNOSIS — L97319 Non-pressure chronic ulcer of right ankle with unspecified severity: Secondary | ICD-10-CM | POA: Diagnosis not present

## 2016-07-01 DIAGNOSIS — E569 Vitamin deficiency, unspecified: Secondary | ICD-10-CM | POA: Diagnosis not present

## 2016-07-01 DIAGNOSIS — R6889 Other general symptoms and signs: Secondary | ICD-10-CM | POA: Diagnosis not present

## 2016-07-04 DIAGNOSIS — L97319 Non-pressure chronic ulcer of right ankle with unspecified severity: Secondary | ICD-10-CM | POA: Diagnosis not present

## 2016-07-11 DIAGNOSIS — I509 Heart failure, unspecified: Secondary | ICD-10-CM | POA: Diagnosis not present

## 2016-07-11 DIAGNOSIS — I4891 Unspecified atrial fibrillation: Secondary | ICD-10-CM | POA: Diagnosis not present

## 2016-07-11 DIAGNOSIS — L97319 Non-pressure chronic ulcer of right ankle with unspecified severity: Secondary | ICD-10-CM | POA: Diagnosis not present

## 2016-07-11 DIAGNOSIS — R1310 Dysphagia, unspecified: Secondary | ICD-10-CM | POA: Diagnosis not present

## 2016-07-11 DIAGNOSIS — G809 Cerebral palsy, unspecified: Secondary | ICD-10-CM | POA: Diagnosis not present

## 2016-07-18 DIAGNOSIS — L97319 Non-pressure chronic ulcer of right ankle with unspecified severity: Secondary | ICD-10-CM | POA: Diagnosis not present

## 2016-07-25 DIAGNOSIS — E46 Unspecified protein-calorie malnutrition: Secondary | ICD-10-CM | POA: Diagnosis not present

## 2016-07-25 DIAGNOSIS — I4891 Unspecified atrial fibrillation: Secondary | ICD-10-CM | POA: Diagnosis not present

## 2016-07-25 DIAGNOSIS — J449 Chronic obstructive pulmonary disease, unspecified: Secondary | ICD-10-CM | POA: Diagnosis not present

## 2016-07-25 DIAGNOSIS — G809 Cerebral palsy, unspecified: Secondary | ICD-10-CM | POA: Diagnosis not present

## 2016-07-29 DIAGNOSIS — L97319 Non-pressure chronic ulcer of right ankle with unspecified severity: Secondary | ICD-10-CM | POA: Diagnosis not present

## 2016-08-13 DIAGNOSIS — S91109A Unspecified open wound of unspecified toe(s) without damage to nail, initial encounter: Secondary | ICD-10-CM | POA: Diagnosis not present

## 2016-08-13 DIAGNOSIS — Z79899 Other long term (current) drug therapy: Secondary | ICD-10-CM | POA: Diagnosis not present

## 2016-08-15 DIAGNOSIS — L89890 Pressure ulcer of other site, unstageable: Secondary | ICD-10-CM | POA: Diagnosis not present

## 2016-08-15 DIAGNOSIS — L89510 Pressure ulcer of right ankle, unstageable: Secondary | ICD-10-CM | POA: Diagnosis not present

## 2016-08-22 DIAGNOSIS — L89510 Pressure ulcer of right ankle, unstageable: Secondary | ICD-10-CM | POA: Diagnosis not present

## 2016-08-22 DIAGNOSIS — L89893 Pressure ulcer of other site, stage 3: Secondary | ICD-10-CM | POA: Diagnosis not present

## 2016-08-29 DIAGNOSIS — L89514 Pressure ulcer of right ankle, stage 4: Secondary | ICD-10-CM | POA: Diagnosis not present

## 2016-08-29 DIAGNOSIS — L89893 Pressure ulcer of other site, stage 3: Secondary | ICD-10-CM | POA: Diagnosis not present

## 2016-09-05 DIAGNOSIS — L89514 Pressure ulcer of right ankle, stage 4: Secondary | ICD-10-CM | POA: Diagnosis not present

## 2016-09-05 DIAGNOSIS — L89893 Pressure ulcer of other site, stage 3: Secondary | ICD-10-CM | POA: Diagnosis not present

## 2016-09-08 DIAGNOSIS — M2550 Pain in unspecified joint: Secondary | ICD-10-CM | POA: Diagnosis not present

## 2016-09-08 DIAGNOSIS — E7800 Pure hypercholesterolemia, unspecified: Secondary | ICD-10-CM | POA: Diagnosis not present

## 2016-09-08 DIAGNOSIS — S91109A Unspecified open wound of unspecified toe(s) without damage to nail, initial encounter: Secondary | ICD-10-CM | POA: Diagnosis not present

## 2016-09-16 DIAGNOSIS — L89893 Pressure ulcer of other site, stage 3: Secondary | ICD-10-CM | POA: Diagnosis not present

## 2016-09-16 DIAGNOSIS — L89514 Pressure ulcer of right ankle, stage 4: Secondary | ICD-10-CM | POA: Diagnosis not present

## 2016-09-19 DIAGNOSIS — L89893 Pressure ulcer of other site, stage 3: Secondary | ICD-10-CM | POA: Diagnosis not present

## 2016-09-19 DIAGNOSIS — L89514 Pressure ulcer of right ankle, stage 4: Secondary | ICD-10-CM | POA: Diagnosis not present

## 2016-09-26 DIAGNOSIS — I4891 Unspecified atrial fibrillation: Secondary | ICD-10-CM | POA: Diagnosis not present

## 2016-09-26 DIAGNOSIS — L89514 Pressure ulcer of right ankle, stage 4: Secondary | ICD-10-CM | POA: Diagnosis not present

## 2016-09-26 DIAGNOSIS — E46 Unspecified protein-calorie malnutrition: Secondary | ICD-10-CM | POA: Diagnosis not present

## 2016-09-26 DIAGNOSIS — R5383 Other fatigue: Secondary | ICD-10-CM | POA: Diagnosis not present

## 2016-09-26 DIAGNOSIS — L89893 Pressure ulcer of other site, stage 3: Secondary | ICD-10-CM | POA: Diagnosis not present

## 2016-09-26 DIAGNOSIS — J449 Chronic obstructive pulmonary disease, unspecified: Secondary | ICD-10-CM | POA: Diagnosis not present

## 2016-09-27 DIAGNOSIS — N390 Urinary tract infection, site not specified: Secondary | ICD-10-CM | POA: Diagnosis not present

## 2016-09-27 DIAGNOSIS — R6889 Other general symptoms and signs: Secondary | ICD-10-CM | POA: Diagnosis not present

## 2016-09-27 DIAGNOSIS — R319 Hematuria, unspecified: Secondary | ICD-10-CM | POA: Diagnosis not present

## 2016-09-27 DIAGNOSIS — D649 Anemia, unspecified: Secondary | ICD-10-CM | POA: Diagnosis not present

## 2016-09-27 DIAGNOSIS — Z79899 Other long term (current) drug therapy: Secondary | ICD-10-CM | POA: Diagnosis not present

## 2016-09-30 DIAGNOSIS — E43 Unspecified severe protein-calorie malnutrition: Secondary | ICD-10-CM | POA: Diagnosis not present

## 2016-09-30 DIAGNOSIS — R1310 Dysphagia, unspecified: Secondary | ICD-10-CM | POA: Diagnosis not present

## 2016-09-30 DIAGNOSIS — R1314 Dysphagia, pharyngoesophageal phase: Secondary | ICD-10-CM | POA: Diagnosis not present

## 2016-09-30 DIAGNOSIS — M6281 Muscle weakness (generalized): Secondary | ICD-10-CM | POA: Diagnosis not present

## 2016-09-30 DIAGNOSIS — I639 Cerebral infarction, unspecified: Secondary | ICD-10-CM | POA: Diagnosis not present

## 2016-09-30 DIAGNOSIS — G809 Cerebral palsy, unspecified: Secondary | ICD-10-CM | POA: Diagnosis not present

## 2016-09-30 DIAGNOSIS — G3184 Mild cognitive impairment, so stated: Secondary | ICD-10-CM | POA: Diagnosis not present

## 2016-09-30 DIAGNOSIS — R1312 Dysphagia, oropharyngeal phase: Secondary | ICD-10-CM | POA: Diagnosis not present

## 2016-10-03 DIAGNOSIS — I639 Cerebral infarction, unspecified: Secondary | ICD-10-CM | POA: Diagnosis not present

## 2016-10-03 DIAGNOSIS — R1314 Dysphagia, pharyngoesophageal phase: Secondary | ICD-10-CM | POA: Diagnosis not present

## 2016-10-03 DIAGNOSIS — G809 Cerebral palsy, unspecified: Secondary | ICD-10-CM | POA: Diagnosis not present

## 2016-10-03 DIAGNOSIS — R1310 Dysphagia, unspecified: Secondary | ICD-10-CM | POA: Diagnosis not present

## 2016-10-03 DIAGNOSIS — Z1612 Extended spectrum beta lactamase (ESBL) resistance: Secondary | ICD-10-CM | POA: Diagnosis not present

## 2016-10-03 DIAGNOSIS — G3184 Mild cognitive impairment, so stated: Secondary | ICD-10-CM | POA: Diagnosis not present

## 2016-10-03 DIAGNOSIS — E876 Hypokalemia: Secondary | ICD-10-CM | POA: Diagnosis not present

## 2016-10-03 DIAGNOSIS — E43 Unspecified severe protein-calorie malnutrition: Secondary | ICD-10-CM | POA: Diagnosis not present

## 2016-10-03 DIAGNOSIS — R1312 Dysphagia, oropharyngeal phase: Secondary | ICD-10-CM | POA: Diagnosis not present

## 2016-10-03 DIAGNOSIS — R5383 Other fatigue: Secondary | ICD-10-CM | POA: Diagnosis not present

## 2016-10-03 DIAGNOSIS — M6281 Muscle weakness (generalized): Secondary | ICD-10-CM | POA: Diagnosis not present

## 2016-10-04 DIAGNOSIS — E43 Unspecified severe protein-calorie malnutrition: Secondary | ICD-10-CM | POA: Diagnosis not present

## 2016-10-04 DIAGNOSIS — I639 Cerebral infarction, unspecified: Secondary | ICD-10-CM | POA: Diagnosis not present

## 2016-10-04 DIAGNOSIS — M6281 Muscle weakness (generalized): Secondary | ICD-10-CM | POA: Diagnosis not present

## 2016-10-04 DIAGNOSIS — G809 Cerebral palsy, unspecified: Secondary | ICD-10-CM | POA: Diagnosis not present

## 2016-10-04 DIAGNOSIS — R1314 Dysphagia, pharyngoesophageal phase: Secondary | ICD-10-CM | POA: Diagnosis not present

## 2016-10-04 DIAGNOSIS — R1312 Dysphagia, oropharyngeal phase: Secondary | ICD-10-CM | POA: Diagnosis not present

## 2016-10-04 DIAGNOSIS — G3184 Mild cognitive impairment, so stated: Secondary | ICD-10-CM | POA: Diagnosis not present

## 2016-10-04 DIAGNOSIS — R1310 Dysphagia, unspecified: Secondary | ICD-10-CM | POA: Diagnosis not present

## 2016-10-06 DIAGNOSIS — J449 Chronic obstructive pulmonary disease, unspecified: Secondary | ICD-10-CM | POA: Diagnosis not present

## 2016-10-06 DIAGNOSIS — R1314 Dysphagia, pharyngoesophageal phase: Secondary | ICD-10-CM | POA: Diagnosis not present

## 2016-10-06 DIAGNOSIS — R1310 Dysphagia, unspecified: Secondary | ICD-10-CM | POA: Diagnosis not present

## 2016-10-06 DIAGNOSIS — M6281 Muscle weakness (generalized): Secondary | ICD-10-CM | POA: Diagnosis not present

## 2016-10-06 DIAGNOSIS — R5383 Other fatigue: Secondary | ICD-10-CM | POA: Diagnosis not present

## 2016-10-06 DIAGNOSIS — E43 Unspecified severe protein-calorie malnutrition: Secondary | ICD-10-CM | POA: Diagnosis not present

## 2016-10-06 DIAGNOSIS — E46 Unspecified protein-calorie malnutrition: Secondary | ICD-10-CM | POA: Diagnosis not present

## 2016-10-06 DIAGNOSIS — G3184 Mild cognitive impairment, so stated: Secondary | ICD-10-CM | POA: Diagnosis not present

## 2016-10-06 DIAGNOSIS — R1312 Dysphagia, oropharyngeal phase: Secondary | ICD-10-CM | POA: Diagnosis not present

## 2016-10-06 DIAGNOSIS — G809 Cerebral palsy, unspecified: Secondary | ICD-10-CM | POA: Diagnosis not present

## 2016-10-06 DIAGNOSIS — Z1612 Extended spectrum beta lactamase (ESBL) resistance: Secondary | ICD-10-CM | POA: Diagnosis not present

## 2016-10-06 DIAGNOSIS — I639 Cerebral infarction, unspecified: Secondary | ICD-10-CM | POA: Diagnosis not present

## 2016-10-07 DIAGNOSIS — M6281 Muscle weakness (generalized): Secondary | ICD-10-CM | POA: Diagnosis not present

## 2016-10-07 DIAGNOSIS — I639 Cerebral infarction, unspecified: Secondary | ICD-10-CM | POA: Diagnosis not present

## 2016-10-07 DIAGNOSIS — L89514 Pressure ulcer of right ankle, stage 4: Secondary | ICD-10-CM | POA: Diagnosis not present

## 2016-10-07 DIAGNOSIS — E43 Unspecified severe protein-calorie malnutrition: Secondary | ICD-10-CM | POA: Diagnosis not present

## 2016-10-07 DIAGNOSIS — R1314 Dysphagia, pharyngoesophageal phase: Secondary | ICD-10-CM | POA: Diagnosis not present

## 2016-10-07 DIAGNOSIS — R1312 Dysphagia, oropharyngeal phase: Secondary | ICD-10-CM | POA: Diagnosis not present

## 2016-10-07 DIAGNOSIS — G3184 Mild cognitive impairment, so stated: Secondary | ICD-10-CM | POA: Diagnosis not present

## 2016-10-07 DIAGNOSIS — G809 Cerebral palsy, unspecified: Secondary | ICD-10-CM | POA: Diagnosis not present

## 2016-10-07 DIAGNOSIS — R1310 Dysphagia, unspecified: Secondary | ICD-10-CM | POA: Diagnosis not present

## 2016-10-07 DIAGNOSIS — L89893 Pressure ulcer of other site, stage 3: Secondary | ICD-10-CM | POA: Diagnosis not present

## 2016-10-08 DIAGNOSIS — R1312 Dysphagia, oropharyngeal phase: Secondary | ICD-10-CM | POA: Diagnosis not present

## 2016-10-08 DIAGNOSIS — R1314 Dysphagia, pharyngoesophageal phase: Secondary | ICD-10-CM | POA: Diagnosis not present

## 2016-10-08 DIAGNOSIS — M6281 Muscle weakness (generalized): Secondary | ICD-10-CM | POA: Diagnosis not present

## 2016-10-08 DIAGNOSIS — G3184 Mild cognitive impairment, so stated: Secondary | ICD-10-CM | POA: Diagnosis not present

## 2016-10-08 DIAGNOSIS — E43 Unspecified severe protein-calorie malnutrition: Secondary | ICD-10-CM | POA: Diagnosis not present

## 2016-10-08 DIAGNOSIS — G809 Cerebral palsy, unspecified: Secondary | ICD-10-CM | POA: Diagnosis not present

## 2016-10-08 DIAGNOSIS — R1310 Dysphagia, unspecified: Secondary | ICD-10-CM | POA: Diagnosis not present

## 2016-10-08 DIAGNOSIS — I639 Cerebral infarction, unspecified: Secondary | ICD-10-CM | POA: Diagnosis not present

## 2016-10-09 DIAGNOSIS — G3184 Mild cognitive impairment, so stated: Secondary | ICD-10-CM | POA: Diagnosis not present

## 2016-10-09 DIAGNOSIS — R1310 Dysphagia, unspecified: Secondary | ICD-10-CM | POA: Diagnosis not present

## 2016-10-09 DIAGNOSIS — R1312 Dysphagia, oropharyngeal phase: Secondary | ICD-10-CM | POA: Diagnosis not present

## 2016-10-09 DIAGNOSIS — R1314 Dysphagia, pharyngoesophageal phase: Secondary | ICD-10-CM | POA: Diagnosis not present

## 2016-10-09 DIAGNOSIS — I639 Cerebral infarction, unspecified: Secondary | ICD-10-CM | POA: Diagnosis not present

## 2016-10-09 DIAGNOSIS — M6281 Muscle weakness (generalized): Secondary | ICD-10-CM | POA: Diagnosis not present

## 2016-10-09 DIAGNOSIS — E43 Unspecified severe protein-calorie malnutrition: Secondary | ICD-10-CM | POA: Diagnosis not present

## 2016-10-09 DIAGNOSIS — G809 Cerebral palsy, unspecified: Secondary | ICD-10-CM | POA: Diagnosis not present

## 2016-10-10 DIAGNOSIS — R1314 Dysphagia, pharyngoesophageal phase: Secondary | ICD-10-CM | POA: Diagnosis not present

## 2016-10-10 DIAGNOSIS — G3184 Mild cognitive impairment, so stated: Secondary | ICD-10-CM | POA: Diagnosis not present

## 2016-10-10 DIAGNOSIS — G809 Cerebral palsy, unspecified: Secondary | ICD-10-CM | POA: Diagnosis not present

## 2016-10-10 DIAGNOSIS — I639 Cerebral infarction, unspecified: Secondary | ICD-10-CM | POA: Diagnosis not present

## 2016-10-10 DIAGNOSIS — M6281 Muscle weakness (generalized): Secondary | ICD-10-CM | POA: Diagnosis not present

## 2016-10-10 DIAGNOSIS — R1310 Dysphagia, unspecified: Secondary | ICD-10-CM | POA: Diagnosis not present

## 2016-10-10 DIAGNOSIS — R1312 Dysphagia, oropharyngeal phase: Secondary | ICD-10-CM | POA: Diagnosis not present

## 2016-10-10 DIAGNOSIS — E43 Unspecified severe protein-calorie malnutrition: Secondary | ICD-10-CM | POA: Diagnosis not present

## 2016-10-11 DIAGNOSIS — R1314 Dysphagia, pharyngoesophageal phase: Secondary | ICD-10-CM | POA: Diagnosis not present

## 2016-10-11 DIAGNOSIS — G809 Cerebral palsy, unspecified: Secondary | ICD-10-CM | POA: Diagnosis not present

## 2016-10-11 DIAGNOSIS — R1310 Dysphagia, unspecified: Secondary | ICD-10-CM | POA: Diagnosis not present

## 2016-10-11 DIAGNOSIS — G3184 Mild cognitive impairment, so stated: Secondary | ICD-10-CM | POA: Diagnosis not present

## 2016-10-11 DIAGNOSIS — E43 Unspecified severe protein-calorie malnutrition: Secondary | ICD-10-CM | POA: Diagnosis not present

## 2016-10-11 DIAGNOSIS — I639 Cerebral infarction, unspecified: Secondary | ICD-10-CM | POA: Diagnosis not present

## 2016-10-11 DIAGNOSIS — M6281 Muscle weakness (generalized): Secondary | ICD-10-CM | POA: Diagnosis not present

## 2016-10-11 DIAGNOSIS — R1312 Dysphagia, oropharyngeal phase: Secondary | ICD-10-CM | POA: Diagnosis not present

## 2016-10-12 DIAGNOSIS — R1312 Dysphagia, oropharyngeal phase: Secondary | ICD-10-CM | POA: Diagnosis not present

## 2016-10-12 DIAGNOSIS — M6281 Muscle weakness (generalized): Secondary | ICD-10-CM | POA: Diagnosis not present

## 2016-10-12 DIAGNOSIS — I639 Cerebral infarction, unspecified: Secondary | ICD-10-CM | POA: Diagnosis not present

## 2016-10-12 DIAGNOSIS — G3184 Mild cognitive impairment, so stated: Secondary | ICD-10-CM | POA: Diagnosis not present

## 2016-10-12 DIAGNOSIS — R1314 Dysphagia, pharyngoesophageal phase: Secondary | ICD-10-CM | POA: Diagnosis not present

## 2016-10-12 DIAGNOSIS — R1310 Dysphagia, unspecified: Secondary | ICD-10-CM | POA: Diagnosis not present

## 2016-10-12 DIAGNOSIS — G809 Cerebral palsy, unspecified: Secondary | ICD-10-CM | POA: Diagnosis not present

## 2016-10-12 DIAGNOSIS — E43 Unspecified severe protein-calorie malnutrition: Secondary | ICD-10-CM | POA: Diagnosis not present

## 2016-10-13 DIAGNOSIS — L89893 Pressure ulcer of other site, stage 3: Secondary | ICD-10-CM | POA: Diagnosis not present

## 2016-10-13 DIAGNOSIS — L89514 Pressure ulcer of right ankle, stage 4: Secondary | ICD-10-CM | POA: Diagnosis not present

## 2016-10-15 DIAGNOSIS — G3184 Mild cognitive impairment, so stated: Secondary | ICD-10-CM | POA: Diagnosis not present

## 2016-10-15 DIAGNOSIS — M6281 Muscle weakness (generalized): Secondary | ICD-10-CM | POA: Diagnosis not present

## 2016-10-15 DIAGNOSIS — R1312 Dysphagia, oropharyngeal phase: Secondary | ICD-10-CM | POA: Diagnosis not present

## 2016-10-15 DIAGNOSIS — R1314 Dysphagia, pharyngoesophageal phase: Secondary | ICD-10-CM | POA: Diagnosis not present

## 2016-10-15 DIAGNOSIS — G809 Cerebral palsy, unspecified: Secondary | ICD-10-CM | POA: Diagnosis not present

## 2016-10-15 DIAGNOSIS — R1310 Dysphagia, unspecified: Secondary | ICD-10-CM | POA: Diagnosis not present

## 2016-10-15 DIAGNOSIS — I639 Cerebral infarction, unspecified: Secondary | ICD-10-CM | POA: Diagnosis not present

## 2016-10-15 DIAGNOSIS — E43 Unspecified severe protein-calorie malnutrition: Secondary | ICD-10-CM | POA: Diagnosis not present

## 2016-10-17 DIAGNOSIS — I639 Cerebral infarction, unspecified: Secondary | ICD-10-CM | POA: Diagnosis not present

## 2016-10-17 DIAGNOSIS — R1312 Dysphagia, oropharyngeal phase: Secondary | ICD-10-CM | POA: Diagnosis not present

## 2016-10-17 DIAGNOSIS — R627 Adult failure to thrive: Secondary | ICD-10-CM | POA: Diagnosis not present

## 2016-10-17 DIAGNOSIS — Z1612 Extended spectrum beta lactamase (ESBL) resistance: Secondary | ICD-10-CM | POA: Diagnosis not present

## 2016-10-17 DIAGNOSIS — G3184 Mild cognitive impairment, so stated: Secondary | ICD-10-CM | POA: Diagnosis not present

## 2016-10-17 DIAGNOSIS — E46 Unspecified protein-calorie malnutrition: Secondary | ICD-10-CM | POA: Diagnosis not present

## 2016-10-17 DIAGNOSIS — R1314 Dysphagia, pharyngoesophageal phase: Secondary | ICD-10-CM | POA: Diagnosis not present

## 2016-10-17 DIAGNOSIS — M6281 Muscle weakness (generalized): Secondary | ICD-10-CM | POA: Diagnosis not present

## 2016-10-17 DIAGNOSIS — E43 Unspecified severe protein-calorie malnutrition: Secondary | ICD-10-CM | POA: Diagnosis not present

## 2016-10-17 DIAGNOSIS — R1310 Dysphagia, unspecified: Secondary | ICD-10-CM | POA: Diagnosis not present

## 2016-10-17 DIAGNOSIS — G809 Cerebral palsy, unspecified: Secondary | ICD-10-CM | POA: Diagnosis not present

## 2016-10-17 DIAGNOSIS — L89893 Pressure ulcer of other site, stage 3: Secondary | ICD-10-CM | POA: Diagnosis not present

## 2016-10-17 DIAGNOSIS — L89514 Pressure ulcer of right ankle, stage 4: Secondary | ICD-10-CM | POA: Diagnosis not present

## 2016-10-18 DIAGNOSIS — R1314 Dysphagia, pharyngoesophageal phase: Secondary | ICD-10-CM | POA: Diagnosis not present

## 2016-10-18 DIAGNOSIS — M6281 Muscle weakness (generalized): Secondary | ICD-10-CM | POA: Diagnosis not present

## 2016-10-18 DIAGNOSIS — R1310 Dysphagia, unspecified: Secondary | ICD-10-CM | POA: Diagnosis not present

## 2016-10-18 DIAGNOSIS — E43 Unspecified severe protein-calorie malnutrition: Secondary | ICD-10-CM | POA: Diagnosis not present

## 2016-10-18 DIAGNOSIS — I639 Cerebral infarction, unspecified: Secondary | ICD-10-CM | POA: Diagnosis not present

## 2016-10-18 DIAGNOSIS — R1312 Dysphagia, oropharyngeal phase: Secondary | ICD-10-CM | POA: Diagnosis not present

## 2016-10-18 DIAGNOSIS — G3184 Mild cognitive impairment, so stated: Secondary | ICD-10-CM | POA: Diagnosis not present

## 2016-10-18 DIAGNOSIS — G809 Cerebral palsy, unspecified: Secondary | ICD-10-CM | POA: Diagnosis not present

## 2016-10-19 DIAGNOSIS — M6281 Muscle weakness (generalized): Secondary | ICD-10-CM | POA: Diagnosis not present

## 2016-10-19 DIAGNOSIS — E7800 Pure hypercholesterolemia, unspecified: Secondary | ICD-10-CM | POA: Diagnosis not present

## 2016-10-19 DIAGNOSIS — G3184 Mild cognitive impairment, so stated: Secondary | ICD-10-CM | POA: Diagnosis not present

## 2016-10-19 DIAGNOSIS — R1310 Dysphagia, unspecified: Secondary | ICD-10-CM | POA: Diagnosis not present

## 2016-10-19 DIAGNOSIS — Z515 Encounter for palliative care: Secondary | ICD-10-CM | POA: Diagnosis not present

## 2016-10-19 DIAGNOSIS — R1314 Dysphagia, pharyngoesophageal phase: Secondary | ICD-10-CM | POA: Diagnosis not present

## 2016-10-19 DIAGNOSIS — E43 Unspecified severe protein-calorie malnutrition: Secondary | ICD-10-CM | POA: Diagnosis not present

## 2016-10-19 DIAGNOSIS — J9610 Chronic respiratory failure, unspecified whether with hypoxia or hypercapnia: Secondary | ICD-10-CM | POA: Diagnosis not present

## 2016-10-19 DIAGNOSIS — R1312 Dysphagia, oropharyngeal phase: Secondary | ICD-10-CM | POA: Diagnosis not present

## 2016-10-19 DIAGNOSIS — G809 Cerebral palsy, unspecified: Secondary | ICD-10-CM | POA: Diagnosis not present

## 2016-10-19 DIAGNOSIS — I639 Cerebral infarction, unspecified: Secondary | ICD-10-CM | POA: Diagnosis not present

## 2016-10-19 DIAGNOSIS — M2550 Pain in unspecified joint: Secondary | ICD-10-CM | POA: Diagnosis not present

## 2016-10-20 DIAGNOSIS — E43 Unspecified severe protein-calorie malnutrition: Secondary | ICD-10-CM | POA: Diagnosis not present

## 2016-10-20 DIAGNOSIS — M6281 Muscle weakness (generalized): Secondary | ICD-10-CM | POA: Diagnosis not present

## 2016-10-20 DIAGNOSIS — R1314 Dysphagia, pharyngoesophageal phase: Secondary | ICD-10-CM | POA: Diagnosis not present

## 2016-10-20 DIAGNOSIS — Z1612 Extended spectrum beta lactamase (ESBL) resistance: Secondary | ICD-10-CM | POA: Diagnosis not present

## 2016-10-20 DIAGNOSIS — R627 Adult failure to thrive: Secondary | ICD-10-CM | POA: Diagnosis not present

## 2016-10-20 DIAGNOSIS — L89514 Pressure ulcer of right ankle, stage 4: Secondary | ICD-10-CM | POA: Diagnosis not present

## 2016-10-20 DIAGNOSIS — G3184 Mild cognitive impairment, so stated: Secondary | ICD-10-CM | POA: Diagnosis not present

## 2016-10-20 DIAGNOSIS — R5383 Other fatigue: Secondary | ICD-10-CM | POA: Diagnosis not present

## 2016-10-20 DIAGNOSIS — G809 Cerebral palsy, unspecified: Secondary | ICD-10-CM | POA: Diagnosis not present

## 2016-10-20 DIAGNOSIS — Z515 Encounter for palliative care: Secondary | ICD-10-CM | POA: Diagnosis not present

## 2016-10-20 DIAGNOSIS — I639 Cerebral infarction, unspecified: Secondary | ICD-10-CM | POA: Diagnosis not present

## 2016-10-20 DIAGNOSIS — J9610 Chronic respiratory failure, unspecified whether with hypoxia or hypercapnia: Secondary | ICD-10-CM | POA: Diagnosis not present

## 2016-10-21 DIAGNOSIS — R1314 Dysphagia, pharyngoesophageal phase: Secondary | ICD-10-CM | POA: Diagnosis not present

## 2016-10-21 DIAGNOSIS — G809 Cerebral palsy, unspecified: Secondary | ICD-10-CM | POA: Diagnosis not present

## 2016-10-21 DIAGNOSIS — J9610 Chronic respiratory failure, unspecified whether with hypoxia or hypercapnia: Secondary | ICD-10-CM | POA: Diagnosis not present

## 2016-10-21 DIAGNOSIS — I639 Cerebral infarction, unspecified: Secondary | ICD-10-CM | POA: Diagnosis not present

## 2016-10-21 DIAGNOSIS — Z515 Encounter for palliative care: Secondary | ICD-10-CM | POA: Diagnosis not present

## 2016-10-21 DIAGNOSIS — G3184 Mild cognitive impairment, so stated: Secondary | ICD-10-CM | POA: Diagnosis not present

## 2016-10-21 DIAGNOSIS — E43 Unspecified severe protein-calorie malnutrition: Secondary | ICD-10-CM | POA: Diagnosis not present

## 2016-10-21 DIAGNOSIS — M6281 Muscle weakness (generalized): Secondary | ICD-10-CM | POA: Diagnosis not present

## 2016-10-22 DIAGNOSIS — Z515 Encounter for palliative care: Secondary | ICD-10-CM | POA: Diagnosis not present

## 2016-10-22 DIAGNOSIS — G3184 Mild cognitive impairment, so stated: Secondary | ICD-10-CM | POA: Diagnosis not present

## 2016-10-22 DIAGNOSIS — R1314 Dysphagia, pharyngoesophageal phase: Secondary | ICD-10-CM | POA: Diagnosis not present

## 2016-10-22 DIAGNOSIS — E43 Unspecified severe protein-calorie malnutrition: Secondary | ICD-10-CM | POA: Diagnosis not present

## 2016-10-22 DIAGNOSIS — J9610 Chronic respiratory failure, unspecified whether with hypoxia or hypercapnia: Secondary | ICD-10-CM | POA: Diagnosis not present

## 2016-10-22 DIAGNOSIS — G809 Cerebral palsy, unspecified: Secondary | ICD-10-CM | POA: Diagnosis not present

## 2016-10-22 DIAGNOSIS — M6281 Muscle weakness (generalized): Secondary | ICD-10-CM | POA: Diagnosis not present

## 2016-10-22 DIAGNOSIS — I639 Cerebral infarction, unspecified: Secondary | ICD-10-CM | POA: Diagnosis not present

## 2016-10-23 DIAGNOSIS — J9610 Chronic respiratory failure, unspecified whether with hypoxia or hypercapnia: Secondary | ICD-10-CM | POA: Diagnosis not present

## 2016-10-23 DIAGNOSIS — Z515 Encounter for palliative care: Secondary | ICD-10-CM | POA: Diagnosis not present

## 2016-10-23 DIAGNOSIS — I639 Cerebral infarction, unspecified: Secondary | ICD-10-CM | POA: Diagnosis not present

## 2016-10-23 DIAGNOSIS — R1314 Dysphagia, pharyngoesophageal phase: Secondary | ICD-10-CM | POA: Diagnosis not present

## 2016-10-23 DIAGNOSIS — E43 Unspecified severe protein-calorie malnutrition: Secondary | ICD-10-CM | POA: Diagnosis not present

## 2016-10-23 DIAGNOSIS — M6281 Muscle weakness (generalized): Secondary | ICD-10-CM | POA: Diagnosis not present

## 2016-10-23 DIAGNOSIS — G809 Cerebral palsy, unspecified: Secondary | ICD-10-CM | POA: Diagnosis not present

## 2016-10-23 DIAGNOSIS — G3184 Mild cognitive impairment, so stated: Secondary | ICD-10-CM | POA: Diagnosis not present

## 2016-10-24 DIAGNOSIS — Z515 Encounter for palliative care: Secondary | ICD-10-CM | POA: Diagnosis not present

## 2016-10-24 DIAGNOSIS — M6281 Muscle weakness (generalized): Secondary | ICD-10-CM | POA: Diagnosis not present

## 2016-10-24 DIAGNOSIS — G3184 Mild cognitive impairment, so stated: Secondary | ICD-10-CM | POA: Diagnosis not present

## 2016-10-24 DIAGNOSIS — I639 Cerebral infarction, unspecified: Secondary | ICD-10-CM | POA: Diagnosis not present

## 2016-10-24 DIAGNOSIS — J9610 Chronic respiratory failure, unspecified whether with hypoxia or hypercapnia: Secondary | ICD-10-CM | POA: Diagnosis not present

## 2016-10-24 DIAGNOSIS — R1314 Dysphagia, pharyngoesophageal phase: Secondary | ICD-10-CM | POA: Diagnosis not present

## 2016-10-24 DIAGNOSIS — E43 Unspecified severe protein-calorie malnutrition: Secondary | ICD-10-CM | POA: Diagnosis not present

## 2016-10-24 DIAGNOSIS — G809 Cerebral palsy, unspecified: Secondary | ICD-10-CM | POA: Diagnosis not present

## 2016-10-25 DIAGNOSIS — G809 Cerebral palsy, unspecified: Secondary | ICD-10-CM | POA: Diagnosis not present

## 2016-10-25 DIAGNOSIS — R1314 Dysphagia, pharyngoesophageal phase: Secondary | ICD-10-CM | POA: Diagnosis not present

## 2016-10-25 DIAGNOSIS — Z515 Encounter for palliative care: Secondary | ICD-10-CM | POA: Diagnosis not present

## 2016-10-25 DIAGNOSIS — J9610 Chronic respiratory failure, unspecified whether with hypoxia or hypercapnia: Secondary | ICD-10-CM | POA: Diagnosis not present

## 2016-10-25 DIAGNOSIS — G3184 Mild cognitive impairment, so stated: Secondary | ICD-10-CM | POA: Diagnosis not present

## 2016-10-25 DIAGNOSIS — E43 Unspecified severe protein-calorie malnutrition: Secondary | ICD-10-CM | POA: Diagnosis not present

## 2016-10-25 DIAGNOSIS — M6281 Muscle weakness (generalized): Secondary | ICD-10-CM | POA: Diagnosis not present

## 2016-10-25 DIAGNOSIS — I639 Cerebral infarction, unspecified: Secondary | ICD-10-CM | POA: Diagnosis not present

## 2016-10-26 DIAGNOSIS — G809 Cerebral palsy, unspecified: Secondary | ICD-10-CM | POA: Diagnosis not present

## 2016-10-26 DIAGNOSIS — M6281 Muscle weakness (generalized): Secondary | ICD-10-CM | POA: Diagnosis not present

## 2016-10-26 DIAGNOSIS — L89893 Pressure ulcer of other site, stage 3: Secondary | ICD-10-CM | POA: Diagnosis not present

## 2016-10-26 DIAGNOSIS — J9610 Chronic respiratory failure, unspecified whether with hypoxia or hypercapnia: Secondary | ICD-10-CM | POA: Diagnosis not present

## 2016-10-26 DIAGNOSIS — L89514 Pressure ulcer of right ankle, stage 4: Secondary | ICD-10-CM | POA: Diagnosis not present

## 2016-10-26 DIAGNOSIS — R1314 Dysphagia, pharyngoesophageal phase: Secondary | ICD-10-CM | POA: Diagnosis not present

## 2016-10-26 DIAGNOSIS — G3184 Mild cognitive impairment, so stated: Secondary | ICD-10-CM | POA: Diagnosis not present

## 2016-10-26 DIAGNOSIS — Z515 Encounter for palliative care: Secondary | ICD-10-CM | POA: Diagnosis not present

## 2016-10-26 DIAGNOSIS — I639 Cerebral infarction, unspecified: Secondary | ICD-10-CM | POA: Diagnosis not present

## 2016-10-26 DIAGNOSIS — E43 Unspecified severe protein-calorie malnutrition: Secondary | ICD-10-CM | POA: Diagnosis not present

## 2016-10-27 DIAGNOSIS — J9610 Chronic respiratory failure, unspecified whether with hypoxia or hypercapnia: Secondary | ICD-10-CM | POA: Diagnosis not present

## 2016-10-27 DIAGNOSIS — I639 Cerebral infarction, unspecified: Secondary | ICD-10-CM | POA: Diagnosis not present

## 2016-10-27 DIAGNOSIS — Z515 Encounter for palliative care: Secondary | ICD-10-CM | POA: Diagnosis not present

## 2016-10-27 DIAGNOSIS — G3184 Mild cognitive impairment, so stated: Secondary | ICD-10-CM | POA: Diagnosis not present

## 2016-10-27 DIAGNOSIS — E43 Unspecified severe protein-calorie malnutrition: Secondary | ICD-10-CM | POA: Diagnosis not present

## 2016-10-27 DIAGNOSIS — M6281 Muscle weakness (generalized): Secondary | ICD-10-CM | POA: Diagnosis not present

## 2016-10-27 DIAGNOSIS — R1314 Dysphagia, pharyngoesophageal phase: Secondary | ICD-10-CM | POA: Diagnosis not present

## 2016-10-27 DIAGNOSIS — G809 Cerebral palsy, unspecified: Secondary | ICD-10-CM | POA: Diagnosis not present

## 2016-10-28 DIAGNOSIS — I639 Cerebral infarction, unspecified: Secondary | ICD-10-CM | POA: Diagnosis not present

## 2016-10-28 DIAGNOSIS — J9610 Chronic respiratory failure, unspecified whether with hypoxia or hypercapnia: Secondary | ICD-10-CM | POA: Diagnosis not present

## 2016-10-28 DIAGNOSIS — M6281 Muscle weakness (generalized): Secondary | ICD-10-CM | POA: Diagnosis not present

## 2016-10-28 DIAGNOSIS — E43 Unspecified severe protein-calorie malnutrition: Secondary | ICD-10-CM | POA: Diagnosis not present

## 2016-10-28 DIAGNOSIS — G809 Cerebral palsy, unspecified: Secondary | ICD-10-CM | POA: Diagnosis not present

## 2016-10-28 DIAGNOSIS — Z515 Encounter for palliative care: Secondary | ICD-10-CM | POA: Diagnosis not present

## 2016-10-28 DIAGNOSIS — G3184 Mild cognitive impairment, so stated: Secondary | ICD-10-CM | POA: Diagnosis not present

## 2016-10-28 DIAGNOSIS — R1314 Dysphagia, pharyngoesophageal phase: Secondary | ICD-10-CM | POA: Diagnosis not present

## 2016-10-29 DIAGNOSIS — G809 Cerebral palsy, unspecified: Secondary | ICD-10-CM | POA: Diagnosis not present

## 2016-10-29 DIAGNOSIS — R1314 Dysphagia, pharyngoesophageal phase: Secondary | ICD-10-CM | POA: Diagnosis not present

## 2016-10-29 DIAGNOSIS — G3184 Mild cognitive impairment, so stated: Secondary | ICD-10-CM | POA: Diagnosis not present

## 2016-10-29 DIAGNOSIS — M6281 Muscle weakness (generalized): Secondary | ICD-10-CM | POA: Diagnosis not present

## 2016-10-29 DIAGNOSIS — Z515 Encounter for palliative care: Secondary | ICD-10-CM | POA: Diagnosis not present

## 2016-10-29 DIAGNOSIS — I639 Cerebral infarction, unspecified: Secondary | ICD-10-CM | POA: Diagnosis not present

## 2016-10-29 DIAGNOSIS — E43 Unspecified severe protein-calorie malnutrition: Secondary | ICD-10-CM | POA: Diagnosis not present

## 2016-10-29 DIAGNOSIS — J9610 Chronic respiratory failure, unspecified whether with hypoxia or hypercapnia: Secondary | ICD-10-CM | POA: Diagnosis not present

## 2016-10-30 DIAGNOSIS — E43 Unspecified severe protein-calorie malnutrition: Secondary | ICD-10-CM | POA: Diagnosis not present

## 2016-10-30 DIAGNOSIS — I639 Cerebral infarction, unspecified: Secondary | ICD-10-CM | POA: Diagnosis not present

## 2016-10-30 DIAGNOSIS — R1314 Dysphagia, pharyngoesophageal phase: Secondary | ICD-10-CM | POA: Diagnosis not present

## 2016-10-30 DIAGNOSIS — J9610 Chronic respiratory failure, unspecified whether with hypoxia or hypercapnia: Secondary | ICD-10-CM | POA: Diagnosis not present

## 2016-10-30 DIAGNOSIS — M6281 Muscle weakness (generalized): Secondary | ICD-10-CM | POA: Diagnosis not present

## 2016-10-30 DIAGNOSIS — G3184 Mild cognitive impairment, so stated: Secondary | ICD-10-CM | POA: Diagnosis not present

## 2016-10-30 DIAGNOSIS — G809 Cerebral palsy, unspecified: Secondary | ICD-10-CM | POA: Diagnosis not present

## 2016-10-30 DIAGNOSIS — Z515 Encounter for palliative care: Secondary | ICD-10-CM | POA: Diagnosis not present

## 2016-10-31 DIAGNOSIS — R1314 Dysphagia, pharyngoesophageal phase: Secondary | ICD-10-CM | POA: Diagnosis not present

## 2016-10-31 DIAGNOSIS — G809 Cerebral palsy, unspecified: Secondary | ICD-10-CM | POA: Diagnosis not present

## 2016-10-31 DIAGNOSIS — E43 Unspecified severe protein-calorie malnutrition: Secondary | ICD-10-CM | POA: Diagnosis not present

## 2016-10-31 DIAGNOSIS — Z515 Encounter for palliative care: Secondary | ICD-10-CM | POA: Diagnosis not present

## 2016-10-31 DIAGNOSIS — M6281 Muscle weakness (generalized): Secondary | ICD-10-CM | POA: Diagnosis not present

## 2016-10-31 DIAGNOSIS — J9610 Chronic respiratory failure, unspecified whether with hypoxia or hypercapnia: Secondary | ICD-10-CM | POA: Diagnosis not present

## 2016-10-31 DIAGNOSIS — G3184 Mild cognitive impairment, so stated: Secondary | ICD-10-CM | POA: Diagnosis not present

## 2016-10-31 DIAGNOSIS — I639 Cerebral infarction, unspecified: Secondary | ICD-10-CM | POA: Diagnosis not present

## 2016-11-01 DIAGNOSIS — J9610 Chronic respiratory failure, unspecified whether with hypoxia or hypercapnia: Secondary | ICD-10-CM | POA: Diagnosis not present

## 2016-11-01 DIAGNOSIS — G809 Cerebral palsy, unspecified: Secondary | ICD-10-CM | POA: Diagnosis not present

## 2016-11-01 DIAGNOSIS — E43 Unspecified severe protein-calorie malnutrition: Secondary | ICD-10-CM | POA: Diagnosis not present

## 2016-11-01 DIAGNOSIS — Z515 Encounter for palliative care: Secondary | ICD-10-CM | POA: Diagnosis not present

## 2016-11-01 DIAGNOSIS — M6281 Muscle weakness (generalized): Secondary | ICD-10-CM | POA: Diagnosis not present

## 2016-11-01 DIAGNOSIS — R1314 Dysphagia, pharyngoesophageal phase: Secondary | ICD-10-CM | POA: Diagnosis not present

## 2016-11-01 DIAGNOSIS — I639 Cerebral infarction, unspecified: Secondary | ICD-10-CM | POA: Diagnosis not present

## 2016-11-01 DIAGNOSIS — G3184 Mild cognitive impairment, so stated: Secondary | ICD-10-CM | POA: Diagnosis not present

## 2016-11-02 DIAGNOSIS — J9610 Chronic respiratory failure, unspecified whether with hypoxia or hypercapnia: Secondary | ICD-10-CM | POA: Diagnosis not present

## 2016-11-02 DIAGNOSIS — Z515 Encounter for palliative care: Secondary | ICD-10-CM | POA: Diagnosis not present

## 2016-11-02 DIAGNOSIS — G3184 Mild cognitive impairment, so stated: Secondary | ICD-10-CM | POA: Diagnosis not present

## 2016-11-02 DIAGNOSIS — G809 Cerebral palsy, unspecified: Secondary | ICD-10-CM | POA: Diagnosis not present

## 2016-11-02 DIAGNOSIS — R1314 Dysphagia, pharyngoesophageal phase: Secondary | ICD-10-CM | POA: Diagnosis not present

## 2016-11-02 DIAGNOSIS — I639 Cerebral infarction, unspecified: Secondary | ICD-10-CM | POA: Diagnosis not present

## 2016-11-02 DIAGNOSIS — E43 Unspecified severe protein-calorie malnutrition: Secondary | ICD-10-CM | POA: Diagnosis not present

## 2016-11-02 DIAGNOSIS — M6281 Muscle weakness (generalized): Secondary | ICD-10-CM | POA: Diagnosis not present

## 2016-11-03 DIAGNOSIS — J9610 Chronic respiratory failure, unspecified whether with hypoxia or hypercapnia: Secondary | ICD-10-CM | POA: Diagnosis not present

## 2016-11-03 DIAGNOSIS — I639 Cerebral infarction, unspecified: Secondary | ICD-10-CM | POA: Diagnosis not present

## 2016-11-03 DIAGNOSIS — Z515 Encounter for palliative care: Secondary | ICD-10-CM | POA: Diagnosis not present

## 2016-11-03 DIAGNOSIS — R1314 Dysphagia, pharyngoesophageal phase: Secondary | ICD-10-CM | POA: Diagnosis not present

## 2016-11-03 DIAGNOSIS — E43 Unspecified severe protein-calorie malnutrition: Secondary | ICD-10-CM | POA: Diagnosis not present

## 2016-11-03 DIAGNOSIS — M6281 Muscle weakness (generalized): Secondary | ICD-10-CM | POA: Diagnosis not present

## 2016-11-03 DIAGNOSIS — G809 Cerebral palsy, unspecified: Secondary | ICD-10-CM | POA: Diagnosis not present

## 2016-11-03 DIAGNOSIS — G3184 Mild cognitive impairment, so stated: Secondary | ICD-10-CM | POA: Diagnosis not present

## 2016-11-04 DIAGNOSIS — G3184 Mild cognitive impairment, so stated: Secondary | ICD-10-CM | POA: Diagnosis not present

## 2016-11-04 DIAGNOSIS — M6281 Muscle weakness (generalized): Secondary | ICD-10-CM | POA: Diagnosis not present

## 2016-11-04 DIAGNOSIS — E43 Unspecified severe protein-calorie malnutrition: Secondary | ICD-10-CM | POA: Diagnosis not present

## 2016-11-04 DIAGNOSIS — Z515 Encounter for palliative care: Secondary | ICD-10-CM | POA: Diagnosis not present

## 2016-11-04 DIAGNOSIS — J9610 Chronic respiratory failure, unspecified whether with hypoxia or hypercapnia: Secondary | ICD-10-CM | POA: Diagnosis not present

## 2016-11-04 DIAGNOSIS — I639 Cerebral infarction, unspecified: Secondary | ICD-10-CM | POA: Diagnosis not present

## 2016-11-04 DIAGNOSIS — G809 Cerebral palsy, unspecified: Secondary | ICD-10-CM | POA: Diagnosis not present

## 2016-11-04 DIAGNOSIS — R1314 Dysphagia, pharyngoesophageal phase: Secondary | ICD-10-CM | POA: Diagnosis not present

## 2016-11-05 DIAGNOSIS — Z515 Encounter for palliative care: Secondary | ICD-10-CM | POA: Diagnosis not present

## 2016-11-05 DIAGNOSIS — E7800 Pure hypercholesterolemia, unspecified: Secondary | ICD-10-CM | POA: Diagnosis not present

## 2016-11-05 DIAGNOSIS — G809 Cerebral palsy, unspecified: Secondary | ICD-10-CM | POA: Diagnosis not present

## 2016-11-05 DIAGNOSIS — L89514 Pressure ulcer of right ankle, stage 4: Secondary | ICD-10-CM | POA: Diagnosis not present

## 2016-11-05 DIAGNOSIS — J9610 Chronic respiratory failure, unspecified whether with hypoxia or hypercapnia: Secondary | ICD-10-CM | POA: Diagnosis not present

## 2016-11-05 DIAGNOSIS — M6281 Muscle weakness (generalized): Secondary | ICD-10-CM | POA: Diagnosis not present

## 2016-11-05 DIAGNOSIS — S91109A Unspecified open wound of unspecified toe(s) without damage to nail, initial encounter: Secondary | ICD-10-CM | POA: Diagnosis not present

## 2016-11-05 DIAGNOSIS — R1314 Dysphagia, pharyngoesophageal phase: Secondary | ICD-10-CM | POA: Diagnosis not present

## 2016-11-05 DIAGNOSIS — L89893 Pressure ulcer of other site, stage 3: Secondary | ICD-10-CM | POA: Diagnosis not present

## 2016-11-05 DIAGNOSIS — E43 Unspecified severe protein-calorie malnutrition: Secondary | ICD-10-CM | POA: Diagnosis not present

## 2016-11-05 DIAGNOSIS — G3184 Mild cognitive impairment, so stated: Secondary | ICD-10-CM | POA: Diagnosis not present

## 2016-11-05 DIAGNOSIS — I639 Cerebral infarction, unspecified: Secondary | ICD-10-CM | POA: Diagnosis not present

## 2016-11-06 DIAGNOSIS — G3184 Mild cognitive impairment, so stated: Secondary | ICD-10-CM | POA: Diagnosis not present

## 2016-11-06 DIAGNOSIS — M6281 Muscle weakness (generalized): Secondary | ICD-10-CM | POA: Diagnosis not present

## 2016-11-06 DIAGNOSIS — J9610 Chronic respiratory failure, unspecified whether with hypoxia or hypercapnia: Secondary | ICD-10-CM | POA: Diagnosis not present

## 2016-11-06 DIAGNOSIS — R1314 Dysphagia, pharyngoesophageal phase: Secondary | ICD-10-CM | POA: Diagnosis not present

## 2016-11-06 DIAGNOSIS — E43 Unspecified severe protein-calorie malnutrition: Secondary | ICD-10-CM | POA: Diagnosis not present

## 2016-11-06 DIAGNOSIS — G809 Cerebral palsy, unspecified: Secondary | ICD-10-CM | POA: Diagnosis not present

## 2016-11-06 DIAGNOSIS — I639 Cerebral infarction, unspecified: Secondary | ICD-10-CM | POA: Diagnosis not present

## 2016-11-06 DIAGNOSIS — Z515 Encounter for palliative care: Secondary | ICD-10-CM | POA: Diagnosis not present

## 2016-11-07 DIAGNOSIS — I639 Cerebral infarction, unspecified: Secondary | ICD-10-CM | POA: Diagnosis not present

## 2016-11-07 DIAGNOSIS — Z515 Encounter for palliative care: Secondary | ICD-10-CM | POA: Diagnosis not present

## 2016-11-07 DIAGNOSIS — G809 Cerebral palsy, unspecified: Secondary | ICD-10-CM | POA: Diagnosis not present

## 2016-11-07 DIAGNOSIS — E43 Unspecified severe protein-calorie malnutrition: Secondary | ICD-10-CM | POA: Diagnosis not present

## 2016-11-07 DIAGNOSIS — M6281 Muscle weakness (generalized): Secondary | ICD-10-CM | POA: Diagnosis not present

## 2016-11-07 DIAGNOSIS — J9610 Chronic respiratory failure, unspecified whether with hypoxia or hypercapnia: Secondary | ICD-10-CM | POA: Diagnosis not present

## 2016-11-07 DIAGNOSIS — R1314 Dysphagia, pharyngoesophageal phase: Secondary | ICD-10-CM | POA: Diagnosis not present

## 2016-11-07 DIAGNOSIS — G3184 Mild cognitive impairment, so stated: Secondary | ICD-10-CM | POA: Diagnosis not present

## 2016-11-08 DIAGNOSIS — M6281 Muscle weakness (generalized): Secondary | ICD-10-CM | POA: Diagnosis not present

## 2016-11-08 DIAGNOSIS — Z515 Encounter for palliative care: Secondary | ICD-10-CM | POA: Diagnosis not present

## 2016-11-08 DIAGNOSIS — G809 Cerebral palsy, unspecified: Secondary | ICD-10-CM | POA: Diagnosis not present

## 2016-11-08 DIAGNOSIS — I639 Cerebral infarction, unspecified: Secondary | ICD-10-CM | POA: Diagnosis not present

## 2016-11-08 DIAGNOSIS — G3184 Mild cognitive impairment, so stated: Secondary | ICD-10-CM | POA: Diagnosis not present

## 2016-11-08 DIAGNOSIS — J9610 Chronic respiratory failure, unspecified whether with hypoxia or hypercapnia: Secondary | ICD-10-CM | POA: Diagnosis not present

## 2016-11-08 DIAGNOSIS — E43 Unspecified severe protein-calorie malnutrition: Secondary | ICD-10-CM | POA: Diagnosis not present

## 2016-11-08 DIAGNOSIS — R1314 Dysphagia, pharyngoesophageal phase: Secondary | ICD-10-CM | POA: Diagnosis not present

## 2016-11-09 DIAGNOSIS — R1314 Dysphagia, pharyngoesophageal phase: Secondary | ICD-10-CM | POA: Diagnosis not present

## 2016-11-09 DIAGNOSIS — G3184 Mild cognitive impairment, so stated: Secondary | ICD-10-CM | POA: Diagnosis not present

## 2016-11-09 DIAGNOSIS — I639 Cerebral infarction, unspecified: Secondary | ICD-10-CM | POA: Diagnosis not present

## 2016-11-09 DIAGNOSIS — J9610 Chronic respiratory failure, unspecified whether with hypoxia or hypercapnia: Secondary | ICD-10-CM | POA: Diagnosis not present

## 2016-11-09 DIAGNOSIS — E43 Unspecified severe protein-calorie malnutrition: Secondary | ICD-10-CM | POA: Diagnosis not present

## 2016-11-09 DIAGNOSIS — G809 Cerebral palsy, unspecified: Secondary | ICD-10-CM | POA: Diagnosis not present

## 2016-11-09 DIAGNOSIS — M6281 Muscle weakness (generalized): Secondary | ICD-10-CM | POA: Diagnosis not present

## 2016-11-09 DIAGNOSIS — Z515 Encounter for palliative care: Secondary | ICD-10-CM | POA: Diagnosis not present

## 2016-11-10 DIAGNOSIS — Z515 Encounter for palliative care: Secondary | ICD-10-CM | POA: Diagnosis not present

## 2016-11-10 DIAGNOSIS — I639 Cerebral infarction, unspecified: Secondary | ICD-10-CM | POA: Diagnosis not present

## 2016-11-10 DIAGNOSIS — G809 Cerebral palsy, unspecified: Secondary | ICD-10-CM | POA: Diagnosis not present

## 2016-11-10 DIAGNOSIS — E43 Unspecified severe protein-calorie malnutrition: Secondary | ICD-10-CM | POA: Diagnosis not present

## 2016-11-10 DIAGNOSIS — J9610 Chronic respiratory failure, unspecified whether with hypoxia or hypercapnia: Secondary | ICD-10-CM | POA: Diagnosis not present

## 2016-11-10 DIAGNOSIS — G3184 Mild cognitive impairment, so stated: Secondary | ICD-10-CM | POA: Diagnosis not present

## 2016-11-10 DIAGNOSIS — R1314 Dysphagia, pharyngoesophageal phase: Secondary | ICD-10-CM | POA: Diagnosis not present

## 2016-11-10 DIAGNOSIS — M6281 Muscle weakness (generalized): Secondary | ICD-10-CM | POA: Diagnosis not present

## 2016-11-11 DIAGNOSIS — E43 Unspecified severe protein-calorie malnutrition: Secondary | ICD-10-CM | POA: Diagnosis not present

## 2016-11-11 DIAGNOSIS — J9610 Chronic respiratory failure, unspecified whether with hypoxia or hypercapnia: Secondary | ICD-10-CM | POA: Diagnosis not present

## 2016-11-11 DIAGNOSIS — G809 Cerebral palsy, unspecified: Secondary | ICD-10-CM | POA: Diagnosis not present

## 2016-11-11 DIAGNOSIS — M6281 Muscle weakness (generalized): Secondary | ICD-10-CM | POA: Diagnosis not present

## 2016-11-11 DIAGNOSIS — L89514 Pressure ulcer of right ankle, stage 4: Secondary | ICD-10-CM | POA: Diagnosis not present

## 2016-11-11 DIAGNOSIS — G3184 Mild cognitive impairment, so stated: Secondary | ICD-10-CM | POA: Diagnosis not present

## 2016-11-11 DIAGNOSIS — R1314 Dysphagia, pharyngoesophageal phase: Secondary | ICD-10-CM | POA: Diagnosis not present

## 2016-11-11 DIAGNOSIS — Z515 Encounter for palliative care: Secondary | ICD-10-CM | POA: Diagnosis not present

## 2016-11-11 DIAGNOSIS — I639 Cerebral infarction, unspecified: Secondary | ICD-10-CM | POA: Diagnosis not present

## 2016-11-12 DIAGNOSIS — R1314 Dysphagia, pharyngoesophageal phase: Secondary | ICD-10-CM | POA: Diagnosis not present

## 2016-11-12 DIAGNOSIS — G809 Cerebral palsy, unspecified: Secondary | ICD-10-CM | POA: Diagnosis not present

## 2016-11-12 DIAGNOSIS — G3184 Mild cognitive impairment, so stated: Secondary | ICD-10-CM | POA: Diagnosis not present

## 2016-11-12 DIAGNOSIS — I639 Cerebral infarction, unspecified: Secondary | ICD-10-CM | POA: Diagnosis not present

## 2016-11-12 DIAGNOSIS — Z515 Encounter for palliative care: Secondary | ICD-10-CM | POA: Diagnosis not present

## 2016-11-12 DIAGNOSIS — J9610 Chronic respiratory failure, unspecified whether with hypoxia or hypercapnia: Secondary | ICD-10-CM | POA: Diagnosis not present

## 2016-11-12 DIAGNOSIS — M6281 Muscle weakness (generalized): Secondary | ICD-10-CM | POA: Diagnosis not present

## 2016-11-12 DIAGNOSIS — E43 Unspecified severe protein-calorie malnutrition: Secondary | ICD-10-CM | POA: Diagnosis not present

## 2016-11-13 DIAGNOSIS — Z515 Encounter for palliative care: Secondary | ICD-10-CM | POA: Diagnosis not present

## 2016-11-13 DIAGNOSIS — J9610 Chronic respiratory failure, unspecified whether with hypoxia or hypercapnia: Secondary | ICD-10-CM | POA: Diagnosis not present

## 2016-11-13 DIAGNOSIS — E43 Unspecified severe protein-calorie malnutrition: Secondary | ICD-10-CM | POA: Diagnosis not present

## 2016-11-13 DIAGNOSIS — M6281 Muscle weakness (generalized): Secondary | ICD-10-CM | POA: Diagnosis not present

## 2016-11-13 DIAGNOSIS — I639 Cerebral infarction, unspecified: Secondary | ICD-10-CM | POA: Diagnosis not present

## 2016-11-13 DIAGNOSIS — G3184 Mild cognitive impairment, so stated: Secondary | ICD-10-CM | POA: Diagnosis not present

## 2016-11-13 DIAGNOSIS — R1314 Dysphagia, pharyngoesophageal phase: Secondary | ICD-10-CM | POA: Diagnosis not present

## 2016-11-13 DIAGNOSIS — G809 Cerebral palsy, unspecified: Secondary | ICD-10-CM | POA: Diagnosis not present

## 2016-11-14 DIAGNOSIS — L89143 Pressure ulcer of left lower back, stage 3: Secondary | ICD-10-CM | POA: Diagnosis not present

## 2016-11-14 DIAGNOSIS — Z515 Encounter for palliative care: Secondary | ICD-10-CM | POA: Diagnosis not present

## 2016-11-14 DIAGNOSIS — I4891 Unspecified atrial fibrillation: Secondary | ICD-10-CM | POA: Diagnosis not present

## 2016-11-14 DIAGNOSIS — M6281 Muscle weakness (generalized): Secondary | ICD-10-CM | POA: Diagnosis not present

## 2016-11-14 DIAGNOSIS — E43 Unspecified severe protein-calorie malnutrition: Secondary | ICD-10-CM | POA: Diagnosis not present

## 2016-11-14 DIAGNOSIS — I509 Heart failure, unspecified: Secondary | ICD-10-CM | POA: Diagnosis not present

## 2016-11-14 DIAGNOSIS — G809 Cerebral palsy, unspecified: Secondary | ICD-10-CM | POA: Diagnosis not present

## 2016-11-14 DIAGNOSIS — I639 Cerebral infarction, unspecified: Secondary | ICD-10-CM | POA: Diagnosis not present

## 2016-11-14 DIAGNOSIS — J9610 Chronic respiratory failure, unspecified whether with hypoxia or hypercapnia: Secondary | ICD-10-CM | POA: Diagnosis not present

## 2016-11-14 DIAGNOSIS — I10 Essential (primary) hypertension: Secondary | ICD-10-CM | POA: Diagnosis not present

## 2016-11-14 DIAGNOSIS — G3184 Mild cognitive impairment, so stated: Secondary | ICD-10-CM | POA: Diagnosis not present

## 2016-11-14 DIAGNOSIS — L89514 Pressure ulcer of right ankle, stage 4: Secondary | ICD-10-CM | POA: Diagnosis not present

## 2016-11-14 DIAGNOSIS — R1314 Dysphagia, pharyngoesophageal phase: Secondary | ICD-10-CM | POA: Diagnosis not present

## 2016-11-15 ENCOUNTER — Encounter (HOSPITAL_COMMUNITY): Payer: Self-pay

## 2016-11-15 ENCOUNTER — Inpatient Hospital Stay (HOSPITAL_COMMUNITY)
Admission: EM | Admit: 2016-11-15 | Discharge: 2016-11-17 | DRG: 308 | Disposition: A | Discharge status: Skilled Nursing Facility | Attending: Internal Medicine | Admitting: Internal Medicine

## 2016-11-15 ENCOUNTER — Inpatient Hospital Stay (HOSPITAL_COMMUNITY)

## 2016-11-15 DIAGNOSIS — I10 Essential (primary) hypertension: Secondary | ICD-10-CM | POA: Diagnosis not present

## 2016-11-15 DIAGNOSIS — Z7982 Long term (current) use of aspirin: Secondary | ICD-10-CM

## 2016-11-15 DIAGNOSIS — I509 Heart failure, unspecified: Secondary | ICD-10-CM | POA: Diagnosis not present

## 2016-11-15 DIAGNOSIS — J9610 Chronic respiratory failure, unspecified whether with hypoxia or hypercapnia: Secondary | ICD-10-CM | POA: Diagnosis not present

## 2016-11-15 DIAGNOSIS — M1990 Unspecified osteoarthritis, unspecified site: Secondary | ICD-10-CM | POA: Diagnosis present

## 2016-11-15 DIAGNOSIS — L89159 Pressure ulcer of sacral region, unspecified stage: Secondary | ICD-10-CM | POA: Diagnosis not present

## 2016-11-15 DIAGNOSIS — I110 Hypertensive heart disease with heart failure: Secondary | ICD-10-CM | POA: Diagnosis present

## 2016-11-15 DIAGNOSIS — Z7189 Other specified counseling: Secondary | ICD-10-CM | POA: Diagnosis not present

## 2016-11-15 DIAGNOSIS — Z66 Do not resuscitate: Secondary | ICD-10-CM | POA: Diagnosis not present

## 2016-11-15 DIAGNOSIS — G809 Cerebral palsy, unspecified: Secondary | ICD-10-CM | POA: Diagnosis not present

## 2016-11-15 DIAGNOSIS — E785 Hyperlipidemia, unspecified: Secondary | ICD-10-CM | POA: Diagnosis not present

## 2016-11-15 DIAGNOSIS — L89019 Pressure ulcer of right elbow, unspecified stage: Secondary | ICD-10-CM | POA: Diagnosis present

## 2016-11-15 DIAGNOSIS — L97419 Non-pressure chronic ulcer of right heel and midfoot with unspecified severity: Secondary | ICD-10-CM | POA: Diagnosis present

## 2016-11-15 DIAGNOSIS — E876 Hypokalemia: Secondary | ICD-10-CM | POA: Diagnosis not present

## 2016-11-15 DIAGNOSIS — R6510 Systemic inflammatory response syndrome (SIRS) of non-infectious origin without acute organ dysfunction: Secondary | ICD-10-CM | POA: Diagnosis present

## 2016-11-15 DIAGNOSIS — R4189 Other symptoms and signs involving cognitive functions and awareness: Secondary | ICD-10-CM | POA: Diagnosis present

## 2016-11-15 DIAGNOSIS — T68XXXA Hypothermia, initial encounter: Secondary | ICD-10-CM | POA: Diagnosis present

## 2016-11-15 DIAGNOSIS — W19XXXA Unspecified fall, initial encounter: Secondary | ICD-10-CM | POA: Diagnosis present

## 2016-11-15 DIAGNOSIS — Z515 Encounter for palliative care: Secondary | ICD-10-CM | POA: Diagnosis not present

## 2016-11-15 DIAGNOSIS — R680 Hypothermia, not associated with low environmental temperature: Secondary | ICD-10-CM | POA: Diagnosis not present

## 2016-11-15 DIAGNOSIS — G808 Other cerebral palsy: Secondary | ICD-10-CM | POA: Diagnosis not present

## 2016-11-15 DIAGNOSIS — M810 Age-related osteoporosis without current pathological fracture: Secondary | ICD-10-CM | POA: Diagnosis present

## 2016-11-15 DIAGNOSIS — T1490XA Injury, unspecified, initial encounter: Secondary | ICD-10-CM | POA: Diagnosis not present

## 2016-11-15 DIAGNOSIS — R296 Repeated falls: Secondary | ICD-10-CM | POA: Diagnosis present

## 2016-11-15 DIAGNOSIS — M6281 Muscle weakness (generalized): Secondary | ICD-10-CM | POA: Diagnosis not present

## 2016-11-15 DIAGNOSIS — Z681 Body mass index (BMI) 19 or less, adult: Secondary | ICD-10-CM

## 2016-11-15 DIAGNOSIS — I959 Hypotension, unspecified: Secondary | ICD-10-CM | POA: Diagnosis present

## 2016-11-15 DIAGNOSIS — Z8673 Personal history of transient ischemic attack (TIA), and cerebral infarction without residual deficits: Secondary | ICD-10-CM | POA: Diagnosis not present

## 2016-11-15 DIAGNOSIS — L89009 Pressure ulcer of unspecified elbow, unspecified stage: Secondary | ICD-10-CM | POA: Diagnosis not present

## 2016-11-15 DIAGNOSIS — I4891 Unspecified atrial fibrillation: Secondary | ICD-10-CM | POA: Diagnosis not present

## 2016-11-15 DIAGNOSIS — E43 Unspecified severe protein-calorie malnutrition: Secondary | ICD-10-CM | POA: Diagnosis not present

## 2016-11-15 DIAGNOSIS — K219 Gastro-esophageal reflux disease without esophagitis: Secondary | ICD-10-CM | POA: Diagnosis not present

## 2016-11-15 DIAGNOSIS — R279 Unspecified lack of coordination: Secondary | ICD-10-CM | POA: Diagnosis not present

## 2016-11-15 DIAGNOSIS — R1314 Dysphagia, pharyngoesophageal phase: Secondary | ICD-10-CM | POA: Diagnosis not present

## 2016-11-15 DIAGNOSIS — L8990 Pressure ulcer of unspecified site, unspecified stage: Secondary | ICD-10-CM | POA: Diagnosis present

## 2016-11-15 DIAGNOSIS — J449 Chronic obstructive pulmonary disease, unspecified: Secondary | ICD-10-CM | POA: Diagnosis present

## 2016-11-15 DIAGNOSIS — R29898 Other symptoms and signs involving the musculoskeletal system: Secondary | ICD-10-CM | POA: Diagnosis not present

## 2016-11-15 DIAGNOSIS — L89600 Pressure ulcer of unspecified heel, unstageable: Secondary | ICD-10-CM | POA: Diagnosis not present

## 2016-11-15 DIAGNOSIS — G3184 Mild cognitive impairment, so stated: Secondary | ICD-10-CM | POA: Diagnosis not present

## 2016-11-15 DIAGNOSIS — R627 Adult failure to thrive: Secondary | ICD-10-CM | POA: Diagnosis present

## 2016-11-15 DIAGNOSIS — Z7401 Bed confinement status: Secondary | ICD-10-CM | POA: Diagnosis not present

## 2016-11-15 DIAGNOSIS — I639 Cerebral infarction, unspecified: Secondary | ICD-10-CM | POA: Diagnosis not present

## 2016-11-15 HISTORY — DX: Unspecified protein-calorie malnutrition: E46

## 2016-11-15 HISTORY — DX: Muscle weakness (generalized): M62.81

## 2016-11-15 HISTORY — DX: Unspecified atrial fibrillation: I48.91

## 2016-11-15 HISTORY — DX: Gastro-esophageal reflux disease without esophagitis: K21.9

## 2016-11-15 HISTORY — DX: Dysphagia, unspecified: R13.10

## 2016-11-15 HISTORY — DX: Pressure ulcer of unspecified site, unspecified stage: L89.90

## 2016-11-15 LAB — COMPREHENSIVE METABOLIC PANEL
ALK PHOS: 91 U/L (ref 38–126)
ALT: 19 U/L (ref 14–54)
ANION GAP: 11 (ref 5–15)
AST: 30 U/L (ref 15–41)
Albumin: 2.6 g/dL — ABNORMAL LOW (ref 3.5–5.0)
BILIRUBIN TOTAL: 0.7 mg/dL (ref 0.3–1.2)
BUN: 29 mg/dL — ABNORMAL HIGH (ref 6–20)
CALCIUM: 9.5 mg/dL (ref 8.9–10.3)
CO2: 23 mmol/L (ref 22–32)
Chloride: 111 mmol/L (ref 101–111)
Creatinine, Ser: 0.83 mg/dL (ref 0.44–1.00)
GFR calc Af Amer: >60 mL/min (ref >60)
Glucose, Bld: 122 mg/dL — ABNORMAL HIGH (ref 65–99)
POTASSIUM: 3.6 mmol/L (ref 3.5–5.1)
Sodium: 145 mmol/L (ref 135–145)
TOTAL PROTEIN: 6.4 g/dL — AB (ref 6.5–8.1)

## 2016-11-15 LAB — CBC WITH DIFFERENTIAL/PLATELET
Basophils Absolute: 0 10*3/uL (ref 0.0–0.1)
Basophils Relative: 0 %
Eosinophils Absolute: 0 10*3/uL (ref 0.0–0.7)
Eosinophils Relative: 0 %
HEMATOCRIT: 36.2 % (ref 36.0–46.0)
Hemoglobin: 12 g/dL (ref 12.0–15.0)
LYMPHS ABS: 1.2 10*3/uL (ref 0.7–4.0)
LYMPHS PCT: 24 %
MCH: 27.7 pg (ref 26.0–34.0)
MCHC: 33.1 g/dL (ref 30.0–36.0)
MCV: 83.6 fL (ref 78.0–100.0)
MONOS PCT: 7 %
Monocytes Absolute: 0.4 10*3/uL (ref 0.1–1.0)
NEUTROS ABS: 3.3 10*3/uL (ref 1.7–7.7)
Neutrophils Relative %: 69 %
Platelets: 249 10*3/uL (ref 150–400)
RBC: 4.33 MIL/uL (ref 3.87–5.11)
RDW: 16.7 % — ABNORMAL HIGH (ref 11.5–15.5)
WBC: 4.8 10*3/uL (ref 4.0–10.5)

## 2016-11-15 LAB — URINALYSIS, ROUTINE W REFLEX MICROSCOPIC
Bacteria, UA: NONE SEEN
Bilirubin Urine: NEGATIVE
GLUCOSE, UA: NEGATIVE mg/dL
KETONES UR: NEGATIVE mg/dL
LEUKOCYTES UA: NEGATIVE
NITRITE: NEGATIVE
PH: 5 (ref 5.0–8.0)
PROTEIN: NEGATIVE mg/dL
SQUAMOUS EPITHELIAL / LPF: NONE SEEN
Specific Gravity, Urine: 1.024 (ref 1.005–1.030)

## 2016-11-15 LAB — TROPONIN I: TROPONIN I: 0.04 ng/mL — AB (ref <0.03)

## 2016-11-15 LAB — MAGNESIUM: MAGNESIUM: 2 mg/dL (ref 1.7–2.4)

## 2016-11-15 LAB — PHOSPHORUS: Phosphorus: 2.5 mg/dL (ref 2.5–4.6)

## 2016-11-15 LAB — TSH: TSH: 3.123 u[IU]/mL (ref 0.350–4.500)

## 2016-11-15 LAB — I-STAT CG4 LACTIC ACID, ED: Lactic Acid, Venous: 1.34 mmol/L (ref 0.5–1.9)

## 2016-11-15 MED ORDER — ONDANSETRON HCL 4 MG PO TABS: 4.0000 mg | ORAL_TABLET | Freq: Four times a day (QID) | ORAL | Status: DC | PRN

## 2016-11-15 MED ORDER — CLOTRIMAZOLE 10 MG MT TROC
OROMUCOSAL | Status: AC
  Filled 2016-11-15: qty 2

## 2016-11-15 MED ORDER — PANTOPRAZOLE SODIUM 40 MG PO TBEC
40.0000 mg | DELAYED_RELEASE_TABLET | Freq: Every day | ORAL | Status: DC
  Administered 2016-11-15 – 2016-11-17 (×3): 40 mg via ORAL
  Filled 2016-11-15 (×3): qty 1

## 2016-11-15 MED ORDER — ACETAMINOPHEN 500 MG PO TABS
1000.0000 mg | ORAL_TABLET | Freq: Four times a day (QID) | ORAL | Status: DC | PRN
  Administered 2016-11-16: 1000 mg via ORAL
  Filled 2016-11-15: qty 2

## 2016-11-15 MED ORDER — SODIUM CHLORIDE 0.9 % IV BOLUS (SEPSIS)
500.0000 mL | Freq: Once | INTRAVENOUS | Status: AC
  Administered 2016-11-15: 500 mL via INTRAVENOUS

## 2016-11-15 MED ORDER — BOOST HIGH PROTEIN PO LIQD
1.0000 | Freq: Three times a day (TID) | ORAL | Status: DC
  Administered 2016-11-15: 237 mL via ORAL
  Administered 2016-11-16: 125 mL via ORAL
  Administered 2016-11-16 – 2016-11-17 (×4): 237 mL via ORAL
  Filled 2016-11-15 (×9): qty 237

## 2016-11-15 MED ORDER — MOMETASONE FURO-FORMOTEROL FUM 200-5 MCG/ACT IN AERO
2.0000 | INHALATION_SPRAY | Freq: Two times a day (BID) | RESPIRATORY_TRACT | Status: DC
  Administered 2016-11-16 – 2016-11-17 (×3): 2 via RESPIRATORY_TRACT
  Filled 2016-11-15: qty 8.8

## 2016-11-15 MED ORDER — POTASSIUM CHLORIDE CRYS ER 20 MEQ PO TBCR
20.0000 meq | EXTENDED_RELEASE_TABLET | Freq: Every day | ORAL | Status: DC
  Administered 2016-11-15 – 2016-11-17 (×3): 20 meq via ORAL
  Filled 2016-11-15 (×3): qty 1

## 2016-11-15 MED ORDER — MIRTAZAPINE 15 MG PO TABS
15.0000 mg | ORAL_TABLET | Freq: Every day | ORAL | Status: DC
  Administered 2016-11-15 – 2016-11-16 (×2): 15 mg via ORAL
  Filled 2016-11-15 (×2): qty 1

## 2016-11-15 MED ORDER — SENNOSIDES-DOCUSATE SODIUM 8.6-50 MG PO TABS
1.0000 | ORAL_TABLET | Freq: Two times a day (BID) | ORAL | Status: DC
  Administered 2016-11-15 – 2016-11-17 (×4): 1 via ORAL
  Filled 2016-11-15 (×4): qty 1

## 2016-11-15 MED ORDER — CLOTRIMAZOLE 10 MG MT TROC
10.0000 mg | Freq: Every day | OROMUCOSAL | Status: DC
  Administered 2016-11-15 – 2016-11-17 (×8): 10 mg via ORAL
  Filled 2016-11-15 (×14): qty 1

## 2016-11-15 MED ORDER — DILTIAZEM HCL-DEXTROSE 100-5 MG/100ML-% IV SOLN (PREMIX)
5.0000 mg/h | Freq: Once | INTRAVENOUS | Status: AC
  Administered 2016-11-15: 5 mg/h via INTRAVENOUS
  Filled 2016-11-15: qty 100

## 2016-11-15 MED ORDER — DILTIAZEM HCL 25 MG/5ML IV SOLN
10.0000 mg | Freq: Once | INTRAVENOUS | Status: AC
  Administered 2016-11-15: 10 mg via INTRAVENOUS
  Filled 2016-11-15: qty 5

## 2016-11-15 MED ORDER — SODIUM CHLORIDE 0.9 % IV BOLUS (SEPSIS)
1000.0000 mL | Freq: Once | INTRAVENOUS | Status: AC
  Administered 2016-11-15: 1000 mL via INTRAVENOUS

## 2016-11-15 MED ORDER — ASPIRIN EC 81 MG PO TBEC
81.0000 mg | DELAYED_RELEASE_TABLET | Freq: Every day | ORAL | Status: DC
  Administered 2016-11-15 – 2016-11-17 (×3): 81 mg via ORAL
  Filled 2016-11-15 (×3): qty 1

## 2016-11-15 MED ORDER — ALBUTEROL SULFATE (2.5 MG/3ML) 0.083% IN NEBU: 3.0000 mL | INHALATION_SOLUTION | Freq: Four times a day (QID) | RESPIRATORY_TRACT | Status: DC | PRN

## 2016-11-15 MED ORDER — ENOXAPARIN SODIUM 40 MG/0.4ML ~~LOC~~ SOLN: 40.0000 mg | SUBCUTANEOUS | Status: DC

## 2016-11-15 MED ORDER — IPRATROPIUM-ALBUTEROL 0.5-2.5 (3) MG/3ML IN SOLN: 3.0000 mL | RESPIRATORY_TRACT | Status: DC | PRN

## 2016-11-15 MED ORDER — POTASSIUM CHLORIDE IN NACL 20-0.9 MEQ/L-% IV SOLN
INTRAVENOUS | Status: AC
  Administered 2016-11-15 – 2016-11-16 (×2): via INTRAVENOUS

## 2016-11-15 MED ORDER — ONDANSETRON HCL 4 MG/2ML IJ SOLN: 4.0000 mg | Freq: Four times a day (QID) | INTRAMUSCULAR | Status: DC | PRN

## 2016-11-15 MED ORDER — BISACODYL 10 MG RE SUPP: 10.0000 mg | Freq: Every day | RECTAL | Status: DC | PRN

## 2016-11-15 MED ORDER — DILTIAZEM HCL-DEXTROSE 100-5 MG/100ML-% IV SOLN (PREMIX)
5.0000 mg/h | INTRAVENOUS | Status: DC
  Administered 2016-11-15: 5 mg/h via INTRAVENOUS
  Administered 2016-11-16: 9 mg/h via INTRAVENOUS
  Administered 2016-11-17: 5 mg/h via INTRAVENOUS
  Filled 2016-11-15 (×3): qty 100

## 2016-11-15 NOTE — ED Notes (Signed)
Pt asking for water.  Water given.  Pt drank without difficulty.  No complaints at this time.

## 2016-11-15 NOTE — ED Notes (Signed)
In to check vitals and heart rate in the 140-150's and irregular.  EKG done and rate 140's .  A -fib.   EKG given to Dr. Ranae Palms and orders received.

## 2016-11-15 NOTE — ED Notes (Signed)
MD Yelverton notified pt's BP dropped below threshold while on Cardizem drip. Drip stopped at this time.

## 2016-11-15 NOTE — ED Provider Notes (Signed)
[facility]-EMER[facility]ENCY DEPT Provider Note   CSN: [id number] Arrival date & time: 11/15/16  0737     History   Chief Complaint Chief Complaint  Patient presents with  . Fall    HPI Traci Martin is a 83 y.o. female.  HPI Patient with history of cerebral palsy found on the floor at bedside by nursing home staff this morning. Unwitnessed fall. Patient is currently denying any pain. No evidence of any trauma. Past Medical History:  Diagnosis Date  . Arthritis   . Atrial fibrillation (HCC)   . Cerebral palsy (HCC)   . CHF (congestive heart failure) (HCC)   . Dysphagia   . Frequent falls   . GERD (gastroesophageal reflux disease)   . History of pelvic fracture   . Hyperlipidemia   . Hypertension   . Muscle weakness   . Osteoporosis   . Polio   . Pressure ulcer   . Protein calorie malnutrition (HCC)   . Stroke [facility])     Patient Active Problem List   Diagnosis Date Noted  . Atrial fibrillation with RVR (HCC) 11/15/2016  . Severe malnutrition (HCC) 11/15/2016  . CAP (community acquired pneumonia) 01/10/2016  . Dysphagia, pharyngoesophageal phase 01/08/2014  . Rhabdomyolysis 03/12/2012  . Atrial fibrillation (HCC) 03/12/2012  . Weakness 03/12/2012  . Dehydration 03/12/2012  . Hypertension 03/12/2012  . Cerebral palsy (HCC) 03/12/2012  . FRACTURE, PELVIS, LEFT 05/12/2010    Past Surgical History:  Procedure Laterality Date  . ESOPHAGOGASTRODUODENOSCOPY N/A 12/05/2012   Procedure: ESOPHAGOGASTRODUODENOSCOPY (EGD);  Surgeon: Malissa Hippo, MD;  Location: [facility] ENDO SUITE;  Service: Endoscopy;  Laterality: N/A;  . HIP PINNING      OB History    No data available       Home Medications    Prior to Admission medications   Medication Sig Start Date End Date Taking? Authorizing Provider  acetaminophen (TYLENOL) 500 MG tablet Take 1,000 mg by mouth every 6 (six) hours as needed for moderate pain.   Yes [provider]  albuterol (PROVENTIL HFA;VENTOLIN HFA) 108  (90 BASE) MCG/ACT inhaler Inhale 1-2 puffs into the lungs every 6 (six) hours as needed for wheezing. 10/28/12  Yes Donnetta Hutching, MD  aspirin EC 81 MG tablet Take 1 tablet (81 mg total) by mouth daily. 03/14/12  Yes Kari Baars, MD  bisacodyl (DULCOLAX) 10 MG suppository Place 10 mg rectally as needed for moderate constipation.   Yes [provider]  diltiazem (CARDIZEM) 60 MG tablet Take 60 mg by mouth 2 (two) times daily.   Yes [provider]  doxycycline (VIBRA-TABS) 100 MG tablet Take 100 mg by mouth 2 (two) times daily.   Yes [provider]  enalapril (VASOTEC) 2.5 MG tablet Take 1 tablet by mouth 2 (two) times daily. 10/14/16  Yes [provider]  feeding supplement (BOOST HIGH PROTEIN) LIQD Take 1 Container by mouth 3 (three) times daily between meals.   Yes [provider]  furosemide (LASIX) 40 MG tablet Take 40 mg by mouth daily. 12/30/15  Yes [provider]  ipratropium-albuterol (DUONEB) 0.5-2.5 (3) MG/3ML SOLN Take 3 mLs by nebulization every 4 (four) hours as needed (shortness of breath).   Yes [provider]  mirtazapine (REMERON) 15 MG tablet Take 1 tablet by mouth at bedtime. 11/13/16  Yes [provider]  omeprazole (PRILOSEC) 20 MG capsule Take 1 capsule by mouth at bedtime. 11/06/16  Yes [provider]  potassium chloride SA (K-DUR,KLOR-CON) 20 MEQ tablet Take  20 mEq by mouth daily.    Yes [provider]  senna-docusate (SENOKOT-S) 8.6-50 MG tablet Take 1 tablet by mouth 2 (two) times daily.   Yes [provider]  SYMBICORT 160-4.5 MCG/ACT inhaler Inhale 2 puffs into the lungs 2 (two) times daily.  01/06/16  Yes [provider]    Family History Family History  Problem Relation Age of Onset  . Family history unknown: Yes    Social History Social History  Substance Use Topics  . Smoking status: Never Smoker  . Smokeless tobacco: Former Neurosurgeon    Types: Snuff  .  Alcohol use No     Allergies   Patient has no known allergies.   Review of Systems Review of Systems  Respiratory: Negative for shortness of breath.   Cardiovascular: Negative for chest pain.  Gastrointestinal: Negative for abdominal pain, nausea and vomiting.  Musculoskeletal: Negative for arthralgias, back pain, joint swelling and neck pain.  Skin: Negative for rash and wound.  Neurological: Negative for syncope, weakness, numbness and headaches.  All other systems reviewed and are negative.    Physical Exam Updated Vital Signs BP (!) 101/52   Pulse (!) 113   Temp (!) 96.8 F (36 C)   Resp (!) 24   SpO2 99%   Physical Exam  Constitutional: She appears well-developed and well-nourished.  Patient with bilateral upper and lower extremities contracted.  HENT:  Head: Normocephalic and atraumatic.  Mouth/Throat: Oropharynx is clear and moist.  Eyes: Pupils are equal, round, and reactive to light. EOM are normal.  Neck: Normal range of motion. Neck supple.  No posterior midline cervical tenderness to palpation.  Cardiovascular: Normal rate and regular rhythm.  Exam reveals no gallop and no friction rub.   No murmur heard. Pulmonary/Chest: Effort normal and breath sounds normal. No respiratory distress. She has no wheezes. She has no rales. She exhibits no tenderness.  Abdominal: Soft. Bowel sounds are normal. There is no tenderness. There is no rebound and no guarding.  Musculoskeletal: Normal range of motion. She exhibits no edema or tenderness.  No tenderness to palpation of the thoracic and lumbar spine. Pelvis is stable. No tenderness to palpation over bilateral hips, knees.  Neurological: She is alert.  Patient answering simple questions. Contracted extremities with limited movement which is the patient's baseline.  Skin: Skin is warm and dry. Capillary refill takes less than 2 seconds. No rash noted. No erythema.  Nursing note and vitals reviewed.    ED Treatments  / Results  Labs (all labs ordered are listed, but only abnormal results are displayed) Labs Reviewed  CBC WITH DIFFERENTIAL/PLATELET - Abnormal; Notable for the following:       Result Value   RDW 16.7 (*)    All other components within normal limits  COMPREHENSIVE METABOLIC PANEL - Abnormal; Notable for the following:    Glucose, Bld 122 (*)    BUN 29 (*)    Total Protein 6.4 (*)    Albumin 2.6 (*)    All other components within normal limits  URINALYSIS, ROUTINE W REFLEX MICROSCOPIC - Abnormal; Notable for the following:    Color, Urine AMBER (*)    APPearance HAZY (*)    Hgb urine dipstick LARGE (*)    All other components within normal limits  URINE CULTURE  URINE CULTURE  MAGNESIUM  I-STAT CG4 LACTIC ACID, ED    EKG  EKG Interpretation  Date/Time:  Tuesday November 15 2016 09:35:48 EDT Ventricular Rate:  134 PR  Interval:    QRS Duration: 74 QT Interval:  314 QTC Calculation: 469 R Axis:   52 Text Interpretation:  Atrial fibrillation Ventricular premature complex Consider RVH or PMI w/ secondary repol abnrm Repolarization abnormality, prob rate related Baseline wander in lead(s) V3 Confirmed by Ranae Palms  MD, Lura Falor ([id number]) on 11/15/2016 3:17:44 PM       Radiology No results found.  Procedures Procedures (including critical care time)  Medications Ordered in ED Medications  diltiazem (CARDIZEM) injection 10 mg (10 mg Intravenous Given 11/15/16 0952)  sodium chloride 0.9 % bolus 500 mL (0 mLs Intravenous Stopped 11/15/16 1130)  sodium chloride 0.9 % bolus 500 mL (0 mLs Intravenous Stopped 11/15/16 1313)  diltiazem (CARDIZEM) 100 mg in dextrose 5% (1 mg/mL) infusion (0 mg/hr Intravenous Stopped 11/15/16 1445)    CRITICAL CARE Performed by: Ranae Palms, Francisco Eyerly Total critical care time: 30 minutes Critical care time was exclusive of separately billable procedures and treating other patients. Critical care was necessary to treat or prevent imminent or  life-threatening deterioration. Critical care was time spent personally by me on the following activities: development of treatment plan with patient and/or surrogate as well as nursing, discussions with consultants, evaluation of patient's response to treatment, examination of patient, obtaining history from patient or surrogate, ordering and performing treatments and interventions, ordering and review of laboratory studies, ordering and review of radiographic studies, pulse oximetry and re-evaluation of patient's condition. Initial Impression / Assessment and Plan / ED Course  I have reviewed the triage vital signs and the nursing notes.  Pertinent labs & imaging results that were available during my care of the patient were reviewed by me and considered in my medical decision making (see chart for details).     Patient appears to be at baseline mental status. No obvious trauma. Patient went into atrial fibrillation with rate in the 150s. Given dose of Cardizem and blood pressure dropped. Responded to IV fluids. Started on Cardizem drip. Discussed with hospitalist who will see patient in emergency department and admit. Final Clinical Impressions(s) / ED Diagnoses   Final diagnoses:  Atrial fibrillation with rapid ventricular response [facility])    New Prescriptions New Prescriptions   No medications on file     Loren Racer, MD 11/15/16 [phone]

## 2016-11-15 NOTE — ED Notes (Signed)
Pt's SNF, [facility], updated on pt's status at this time. Pt's Child psychotherapist and hospice representative at bedside requesting to speak with EDP, EDP [place] notified.

## 2016-11-15 NOTE — ED Triage Notes (Signed)
EMS reports pt is a resident at [facility] skilled nursing facility.  Reports staff found pt on the floor.  Reports pt has an air mattress on her bed that pumps up intermittently and staff thinks pt may have rolled off the bed onto the floor.  Pt not complaining of any pain.

## 2016-11-15 NOTE — Progress Notes (Signed)
Pt Bp was running low, was able to get a peds cuff to help with accuracy. HR still in 130-150's started cardizem gtt now will titrate slowly due to hx of cardizem dropping pts bp. Bladder scanned completed, 0mL. Pt receiving slow bolus over 2 hours and had a chest xray completed. Dr hobbs came to see her and also ordered meds for her mouth thrush. First  Troponin was 0.09 and I let Dr. Melynda Ripple know. Will continue to monitor closely.  Cristie Hem, RN 9:35 PM 11/15/16

## 2016-11-15 NOTE — ED Notes (Signed)
MD notified of pt's increasing and sustaining HR in 130s-140s. Also notified of pt's temp foley reading fluctuating 1 degree celsius within 1 minute, then returning back to a normal core temp, this happening multiple times.

## 2016-11-15 NOTE — H&P (Signed)
History and Physical  AIYAH SCARPELLI ZOX:[id number] DOB: Mar 27, 1924 DOA: 11/15/2016   PCP: Charlynne Pander, MD   Patient coming from: Home  Chief Complaint: found on floor at [facility]  HPI:  Traci Martin is a 83 y.o. female with medical history of "cerebal palsy"/cognitive impairment was brought to the emergency department where she was found on the floor at the bedside at her nursing home from a presumable unwitnessed fall. According to the nursing home staff, the patient was at her mental baseline and awake and alert.  Secondary to the patient's cognitive impairment, she is not able to provide any significant history. However, she denies any fevers, chills, chest pain, shortness breath, abdominal pain, headache. The remainder of the review of systems unobtainable secondary to patient's cognitive impairment.  In the emergency department, the patient developed atrial fibrillation with RVR with heart rate up to 150.  The patient subsequently was noted to be hypothermic with a temperature of 95.53F. She became hypotensive down to 75/49. The patient was given 1 L of normal saline with improvement of her blood pressure. She was started on diltiazem drip after a bolus, but she remained in RVR. As a result, the patient was made for further evaluation.  Assessment/Plan: Atrial fibrillation with RVR -CHADS-VASc = 6 (HTN, age, stroke, female) -Continue aspirin -Not on anticoagulation presumably secondary to the patient's comorbidities -Continue diltiazem drip -TSH -Echocardiogram -Continue aspirin  Hypothermia -Blood cultures 2 sets -Add urine culture -Start empiric ceftriaxone pending culture data -TSH -A.m. Cortisol -Warming blanket  COPD -Presently stable on room air -Continue LABA  Hx of stroke and dysphagia -start dys 3 diet -speech therapy eval  Essential hypertension -Holding Vasotec secondary to soft blood pressures -Diltiazem drip as discussed -Holding  furosemide  Cognitive impairment -At baseline  Severe malnutrition -Nutrition consult  Goals of care -As listed in the patient's paperwork that accompanied the patient with the transfer to the hospital, the patient is listed as a full code -Asked social work to clarify -Palliative medicine consult  Sacral ulcer/right heel ulcer - wound Care consult -Present prior to admission       Past Medical History:  Diagnosis Date  . Arthritis   . Atrial fibrillation (HCC)   . Cerebral palsy (HCC)   . CHF (congestive heart failure) (HCC)   . Dysphagia   . Frequent falls   . GERD (gastroesophageal reflux disease)   . History of pelvic fracture   . Hyperlipidemia   . Hypertension   . Muscle weakness   . Osteoporosis   . Polio   . Pressure ulcer   . Protein calorie malnutrition (HCC)   . Stroke [facility])    Past Surgical History:  Procedure Laterality Date  . ESOPHAGOGASTRODUODENOSCOPY N/A 12/05/2012   Procedure: ESOPHAGOGASTRODUODENOSCOPY (EGD);  Surgeon: Malissa Hippo, MD;  Location: [facility];  Service: Endoscopy;  Laterality: N/A;  . HIP PINNING     Social History:  reports that she has never smoked. She has quit using smokeless tobacco. Her smokeless tobacco use included Snuff. She reports that she does not drink alcohol or use drugs.   Family History  Problem Relation Age of Onset  . Family history unknown: Yes     No Known Allergies   Prior to Admission medications   Medication Sig Start Date End Date Taking? Authorizing Provider  acetaminophen (TYLENOL) 500 MG tablet Take 1,000 mg by mouth every 6 (six) hours as needed for moderate pain.   Yes  [provider]  albuterol (PROVENTIL HFA;VENTOLIN HFA) 108 (90 BASE) MCG/ACT inhaler Inhale 1-2 puffs into the lungs every 6 (six) hours as needed for wheezing. 10/28/12  Yes Donnetta Hutching, MD  aspirin EC 81 MG tablet Take 1 tablet (81 mg total) by mouth daily. 03/14/12  Yes Kari Baars, MD  bisacodyl  (DULCOLAX) 10 MG suppository Place 10 mg rectally as needed for moderate constipation.   Yes [provider]  diltiazem (CARDIZEM) 60 MG tablet Take 60 mg by mouth 2 (two) times daily.   Yes [provider]  doxycycline (VIBRA-TABS) 100 MG tablet Take 100 mg by mouth 2 (two) times daily.   Yes [provider]  enalapril (VASOTEC) 2.5 MG tablet Take 1 tablet by mouth 2 (two) times daily. 10/14/16  Yes [provider]  feeding supplement (BOOST HIGH PROTEIN) LIQD Take 1 Container by mouth 3 (three) times daily between meals.   Yes [provider]  furosemide (LASIX) 40 MG tablet Take 40 mg by mouth daily. 12/30/15  Yes [provider]  ipratropium-albuterol (DUONEB) 0.5-2.5 (3) MG/3ML SOLN Take 3 mLs by nebulization every 4 (four) hours as needed (shortness of breath).   Yes [provider]  mirtazapine (REMERON) 15 MG tablet Take 1 tablet by mouth at bedtime. 11/13/16  Yes [provider]  omeprazole (PRILOSEC) 20 MG capsule Take 1 capsule by mouth at bedtime. 11/06/16  Yes [provider]  potassium chloride SA (K-DUR,KLOR-CON) 20 MEQ tablet Take 20 mEq by mouth daily.    Yes [provider]  senna-docusate (SENOKOT-S) 8.6-50 MG tablet Take 1 tablet by mouth 2 (two) times daily.   Yes [provider]  SYMBICORT 160-4.5 MCG/ACT inhaler Inhale 2 puffs into the lungs 2 (two) times daily.  01/06/16  Yes [provider]    Review of Systems:  Very limited secondary to the patient's cognitive impairment. Unobtainable other than discussed above.  Physical Exam: Vitals:   11/15/16 1223 11/15/16 1230 11/15/16 1300 11/15/16 1326  BP:  127/68 130/79   Pulse: 81  (!) 138   Resp: (!) Temp: 97.7 F (36.5 C) (!) 95.7 F (35.4 C) (!) 96.4 F (35.8 C) (!) 95.9 F (35.5 C)  TempSrc:      SpO2: 100%  97%    General:  A&O x 1, NAD, nontoxic, pleasant/cooperative Head/Eye: No conjunctival  hemorrhage, no icterus, [place]/AT, No nystagmus ENT:  No icterus,  No thrush, good dentition, no pharyngeal exudate Neck:  No masses, no lymphadenpathy, no bruits CV:  IRRR, no rub, no gallop, no S3 Lung:  CTAB, good air movement, no wheeze, no rhonchi Abdomen: soft/NT, +BS, nondistended, no peritoneal signs Ext: No cyanosis, No rashes, No petechiae, No lymphangitis, No edema   Labs on Admission:  Basic Metabolic Panel:  Recent Labs Lab 11/15/16 1020  NA 145  K 3.6  CL 111  CO2 23  GLUCOSE 122*  BUN 29*  CREATININE 0.83  CALCIUM 9.5  MG 2.0   Liver Function Tests:  Recent Labs Lab 11/15/16 1020  AST 30  ALT 19  ALKPHOS 91  BILITOT 0.7  PROT 6.4*  ALBUMIN 2.6*   No results for input(s): LIPASE, AMYLASE in the last 168 hours. No results for input(s): AMMONIA in the last 168 hours. CBC:  Recent Labs Lab 11/15/16 1020  WBC 4.8  NEUTROABS 3.3  HGB 12.0  HCT 36.2  MCV 83.6  PLT 249   Coagulation Profile: No results for  input(s): INR, PROTIME in the last 168 hours. Cardiac Enzymes: No results for input(s): CKTOTAL, CKMB, CKMBINDEX, TROPONINI in the last 168 hours. BNP: Invalid input(s): POCBNP CBG: No results for input(s): GLUCAP in the last 168 hours. Urine analysis:    Component Value Date/Time   COLORURINE AMBER (A) 11/15/2016 0939   APPEARANCEUR HAZY (A) 11/15/2016 0939   LABSPEC 1.024 11/15/2016 0939   PHURINE 5.0 11/15/2016 0939   GLUCOSEU NEGATIVE 11/15/2016 0939   HGBUR LARGE (A) 11/15/2016 0939   BILIRUBINUR NEGATIVE 11/15/2016 0939   KETONESUR NEGATIVE 11/15/2016 0939   PROTEINUR NEGATIVE 11/15/2016 0939   UROBILINOGEN 0.2 03/09/2012 1215   NITRITE NEGATIVE 11/15/2016 0939   LEUKOCYTESUR NEGATIVE 11/15/2016 0939   Sepsis Labs: (procalcitonin:4,lacticidven:4) )No results found for this or any previous visit (from the past 240 hour(s)).   Radiological Exams on Admission: No results found.  EKG: Independently reviewed. Atrial fib  rvr, nonspecific ST changes    Time spent:60 minutes Code Status:   FULL Family Communication:  No Family at bedside Disposition Plan: expect 2-3 day hospitalization Consults called: palliative medicine DVT Prophylaxis: Arthur Lovenox  Skylynn Burkley, DO  [facility] Pager [phone]  If 7PM-7AM, please contact night-coverage [email or web address] Password [email or web address] 11/15/2016, 1:30 PM

## 2016-11-15 NOTE — ED Notes (Signed)
Pt rotated on to L side at this time, denies any pain or further needs. Updated on wait for bed.

## 2016-11-15 NOTE — ED Notes (Signed)
[name], guardianship social worker, [phone] ext [phone].

## 2016-11-15 NOTE — ED Notes (Signed)
Pt placed on baer hugger

## 2016-11-15 NOTE — ED Notes (Signed)
Report given to Cindy, RN ICU.  

## 2016-11-16 ENCOUNTER — Encounter (HOSPITAL_COMMUNITY): Payer: Self-pay | Admitting: Primary Care

## 2016-11-16 ENCOUNTER — Inpatient Hospital Stay (HOSPITAL_COMMUNITY)

## 2016-11-16 DIAGNOSIS — Z515 Encounter for palliative care: Secondary | ICD-10-CM

## 2016-11-16 DIAGNOSIS — L8990 Pressure ulcer of unspecified site, unspecified stage: Secondary | ICD-10-CM | POA: Diagnosis present

## 2016-11-16 DIAGNOSIS — Z7189 Other specified counseling: Secondary | ICD-10-CM

## 2016-11-16 DIAGNOSIS — L89009 Pressure ulcer of unspecified elbow, unspecified stage: Secondary | ICD-10-CM

## 2016-11-16 LAB — BASIC METABOLIC PANEL
Anion gap: 6 (ref 5–15)
BUN: 22 mg/dL — AB (ref 6–20)
CALCIUM: 8.7 mg/dL — AB (ref 8.9–10.3)
CO2: 21 mmol/L — ABNORMAL LOW (ref 22–32)
Chloride: 116 mmol/L — ABNORMAL HIGH (ref 101–111)
Creatinine, Ser: 0.65 mg/dL (ref 0.44–1.00)
GFR calc Af Amer: >60 mL/min (ref >60)
GLUCOSE: 96 mg/dL (ref 65–99)
Potassium: 3.4 mmol/L — ABNORMAL LOW (ref 3.5–5.1)
Sodium: 143 mmol/L (ref 135–145)

## 2016-11-16 LAB — CBC
HCT: 31.9 % — ABNORMAL LOW (ref 36.0–46.0)
Hemoglobin: 10.4 g/dL — ABNORMAL LOW (ref 12.0–15.0)
MCH: 27.7 pg (ref 26.0–34.0)
MCHC: 32.6 g/dL (ref 30.0–36.0)
MCV: 85.1 fL (ref 78.0–100.0)
Platelets: 254 10*3/uL (ref 150–400)
RBC: 3.75 MIL/uL — ABNORMAL LOW (ref 3.87–5.11)
RDW: 16.7 % — AB (ref 11.5–15.5)
WBC: 6.2 10*3/uL (ref 4.0–10.5)

## 2016-11-16 LAB — TROPONIN I
Troponin I: 0.03 ng/mL (ref <0.03)
Troponin I: <0.03 ng/mL (ref <0.03)

## 2016-11-16 LAB — MAGNESIUM: MAGNESIUM: 1.8 mg/dL (ref 1.7–2.4)

## 2016-11-16 LAB — URINE CULTURE: CULTURE: NO GROWTH

## 2016-11-16 LAB — CORTISOL-AM, BLOOD: CORTISOL - AM: 22.5 ug/dL (ref 6.7–22.6)

## 2016-11-16 MED ORDER — COLLAGENASE 250 UNIT/GM EX OINT
TOPICAL_OINTMENT | Freq: Every day | CUTANEOUS | Status: DC
  Administered 2016-11-17: 09:00:00 via TOPICAL
  Filled 2016-11-16: qty 30

## 2016-11-16 MED ORDER — MAGNESIUM SULFATE 2 GM/50ML IV SOLN
2.0000 g | Freq: Once | INTRAVENOUS | Status: AC
  Administered 2016-11-16: 2 g via INTRAVENOUS
  Filled 2016-11-16: qty 50

## 2016-11-16 NOTE — Consult Note (Signed)
Consultation Note Date: 11/16/2016   Patient Name: Traci Martin  DOB: 1924/04/23  MRN: [id number]  Age / Sex: 83 y.o., female  PCP: Charlynne Pander, MD Referring Physician: Eddie North, MD  Reason for Consultation: Establishing goals of care and Psychosocial/spiritual support  HPI/Patient Profile: 83 y.o. female  with past medical history of Atrial fibrillation, cerebral palsy, CHF, history of pelvic fracture, history of high blood pressure and increased cholesterol, in history of protein calorie malnutrition, that down and contracted admitted on 11/15/2016 with atrial fibrillation with RVR.   Clinical Assessment and Goals of Care: Ms. Augusta is lying quietly in bed. She will make but not keep eye contact.  She has multiple wounds, and tells me she is uncomfortable. There is no family at bedside at this time. Call to legal guardian, DSS SW [name] at [phone] extension [phone]. I updated her on this Plaisted condition including the goal of weaning her Cardizem drip to off, transfer to the floor, and return to [facility] within the next few days. Tobi Bastos states that it's they took guardianship of Miss Holquin February of this year, when she was placed in residential SNF.   We talk about what's next for miss Wiedman. I share my concern over her frailty, her functional state, her wounds. Tobi Bastos shares that Glade Stanford has suggested a feeding tube, but they felt that that was not right for Ms. Folden. I share that the medical team would agree, it would not change what's happening.   We talk about the concept of allowing natural death (DNR). Tobi Bastos agrees that Ms. Waltermire should be allowed a natural death, code status DNR. We also talk about the concept of let nature take its course. I share that while modern medicine can extend life, but, it can also extend the dying process and suffering. I encourage Tobi Bastos to allow  Ms. Jolley to stay where she knows the staff, and is known by others. I share my worry that she may come to the hospital next time and pass here.  Tobi Bastos states that she will discuss Ms. Royals case with her supervisor and coworkers tomorrow morning. I give her my personal cell phone number, and I encourage her to call/text with any questions or concerns.  Healthcare power of attorney LEGAL GUARDIAN - [employer] [place], [name]. [phone] X [phone]   SUMMARY OF RECOMMENDATIONS   allow a natural death. Considering do not re-hospitalize.  Current [facility] hospice patient.   Code Status/Advance Care Planning:  DNR - completed after discussion with [facility] representative [place].   Symptom Management:   Per hospitalist, no additional needs.   Palliative Prophylaxis:   Palliative Wound Care and Turn Reposition  Additional Recommendations (Limitations, Scope, Preferences):  treat the treatable, no CPR no intubation.   Psycho-social/Spiritual:   Desire for further Chaplaincy support:no  Additional Recommendations: Caregiving  Support/Resources  Prognosis:   < 3 months, would not be surprising based on frailty, contractures, wounds. Hospice patient.   Discharge Planning: return to [facility]  as resident wtih [facility] hosp[facility].       Primary Diagnoses: Present on Admission: . Atrial fibrillation with RVR (HCC) . Dysphagia, pharyngoesophageal phase . Cerebral palsy (HCC) . Hypertension   I have reviewed the medical record, interviewed the patient and family, and examined the patient. The following aspects are pertinent.  Past Medical History:  Diagnosis Date  . Arthritis   . Atrial fibrillation (HCC)   . Cerebral palsy (HCC)   . CHF (congestive heart failure) (HCC)   . Dysphagia   . Frequent falls   . GERD (gastroesophageal reflux disease)   . History of pelvic fracture   . Hyperlipidemia   . Hypertension   . Muscle weakness   . Osteoporosis   .  Polio   . Pressure ulcer   . Protein calorie malnutrition (HCC)   . Stroke [facility])    Social History   Social History  . Marital status: Widowed    Spouse name: N/A  . Number of children: N/A  . Years of education: N/A   Social History Main Topics  . Smoking status: Never Smoker  . Smokeless tobacco: Former Neurosurgeon    Types: Snuff  . Alcohol use No  . Drug use: No  . Sexual activity: Not Asked   Other Topics Concern  . None   Social History Narrative  . None   Family History  Problem Relation Age of Onset  . Family history unknown: Yes   Scheduled Meds: . aspirin EC  81 mg Oral Daily  . clotrimazole  10 mg Oral 5 X Daily  . [START ON 11/17/2016] collagenase   Topical Daily  . enoxaparin (LOVENOX) injection  40 mg Subcutaneous Q24H  . feeding supplement  1 Container Oral TID BM  . mirtazapine  15 mg Oral QHS  . mometasone-formoterol  2 puff Inhalation BID  . pantoprazole  40 mg Oral Daily  . potassium chloride SA  20 mEq Oral Daily  . senna-docusate  1 tablet Oral BID   Continuous Infusions: . 0.9 % NaCl with KCl 20 mEq / L 75 mL/hr at 11/16/16 0800  . diltiazem (CARDIZEM) infusion 9 mg/hr (11/16/16 0800)   PRN Meds:.acetaminophen, albuterol, bisacodyl, ipratropium-albuterol, ondansetron **OR** ondansetron (ZOFRAN) IV Medications Prior to Admission:  Prior to Admission medications   Medication Sig Start Date End Date Taking? Authorizing Provider  acetaminophen (TYLENOL) 500 MG tablet Take 1,000 mg by mouth every 6 (six) hours as needed for moderate pain.   Yes [provider]  albuterol (PROVENTIL HFA;VENTOLIN HFA) 108 (90 BASE) MCG/ACT inhaler Inhale 1-2 puffs into the lungs every 6 (six) hours as needed for wheezing. 10/28/12  Yes Donnetta Hutching, MD  aspirin EC 81 MG tablet Take 1 tablet (81 mg total) by mouth daily. 03/14/12  Yes Kari Baars, MD  bisacodyl (DULCOLAX) 10 MG suppository Place 10 mg rectally as needed for moderate constipation.   Yes [provider]  diltiazem (CARDIZEM) 60 MG tablet Take 60 mg by mouth 2 (two) times daily.   Yes [provider]  doxycycline (VIBRA-TABS) 100 MG tablet Take 100 mg by mouth 2 (two) times daily.   Yes [provider]  enalapril (VASOTEC) 2.5 MG tablet Take 1 tablet by mouth 2 (two) times daily. 10/14/16  Yes [provider]  feeding supplement (BOOST HIGH PROTEIN) LIQD Take 1 Container by mouth 3 (three) times daily between meals.   Yes [provider]  furosemide (LASIX) 40 MG tablet Take 40 mg by mouth  daily. 12/30/15  Yes [provider]  ipratropium-albuterol (DUONEB) 0.5-2.5 (3) MG/3ML SOLN Take 3 mLs by nebulization every 4 (four) hours as needed (shortness of breath).   Yes [provider]  mirtazapine (REMERON) 15 MG tablet Take 1 tablet by mouth at bedtime. 11/13/16  Yes [provider]  omeprazole (PRILOSEC) 20 MG capsule Take 1 capsule by mouth at bedtime. 11/06/16  Yes [provider]  potassium chloride SA (K-DUR,KLOR-CON) 20 MEQ tablet Take 20 mEq by mouth daily.    Yes [provider]  senna-docusate (SENOKOT-S) 8.6-50 MG tablet Take 1 tablet by mouth 2 (two) times daily.   Yes [provider]  SYMBICORT 160-4.5 MCG/ACT inhaler Inhale 2 puffs into the lungs 2 (two) times daily.  01/06/16  Yes [provider]   No Known Allergies Review of Systems  Unable to perform ROS: Age    Physical Exam  Nursing note and vitals reviewed.   Vital Signs: BP 136/71   Pulse (!) 113   Temp 98.1 F (36.7 C)   Resp (!) 23   Wt 32.8 kg (72 lb 5 oz)   SpO2 95%   BMI 13.23 kg/m  Pain Assessment: No/denies pain POSS *See Group Information*: S-Acceptable,Sleep, easy to arouse Pain Score: 0-No pain   SpO2: SpO2: 95 % O2 Device:SpO2: 95 % O2 Flow Rate: .   IO: Intake/output summary:  Intake/Output Summary (Last 24 hours) at 11/16/16 1322 Last data filed at 11/16/16 0800  Gross per 24 hour    Intake           181.42 ml  Output              345 ml  Net          -163.58 ml    LBM: Last BM Date: 11/16/16 Baseline Weight: Weight: 32.8 kg (72 lb 5 oz) Most recent weight: Weight: 32.8 kg (72 lb 5 oz)     Palliative Assessment/Data:   Flowsheet Rows     Most Recent Value  Intake Tab  Referral Department  Hospitalist  Palliative Care Primary Diagnosis  Cardiac  Date Notified  11/15/16  Palliative Care Type  New Palliative care  Reason for referral  Clarify Goals of Care  Date of Admission  11/15/16  Date first seen by Palliative Care  11/16/16  # of days Palliative referral response time  1 Day(s)  # of days IP prior to Palliative referral  0  Clinical Assessment  Palliative Performance Scale Score  30%  Pain Max last 24 hours  Not able to report  Pain Min Last 24 hours  Not able to report  Dyspnea Max Last 24 Hours  Not able to report  Dyspnea Min Last 24 hours  Not able to report  Psychosocial & Spiritual Assessment  Palliative Care Outcomes  Patient/Family meeting held?  Yes  Who was at the meeting?  patient at bedside       Time In: 1250 Time Out: 1325  Time Total: 35 minutes Greater than 50%  of this time was spent counseling and coordinating care related to the above assessment and plan.  Signed by: Katheran Awe, NP   Please contact Palliative Medicine Team phone at [phone] for questions and concerns.  For individual provider: See Loretha Stapler

## 2016-11-16 NOTE — Consult Note (Signed)
WOC Nurse wound consult note Reason for Consult: Numerous pressure injuries, necrotic tissue due to vascular insufficiency, skin tears, right LE contracture, fecal incontinence with mild IAD in the perirectal area, indwelling urinary catheter. Wound type:Pressure, vascular (suspect arterial) insufficiency Pressure Injury POA: Yes Measurement: Left IT:  4cm x 3cm with depth unable to be determined due to the presence of necrotic tissue Left trochanter head: blanching erythema measuring 3cm round Left rib: blanching erythema measuring 2cm x 5cm Left shoulder: blanching erythema measuring 3cm round Right medial foot: 2cm x 3cm purple area of firm eschar obscuring depth Right lateral malleolus: 5cm x 6cm with depth obscured by the presence of necrotic tissue Right lateral foot: 3.5cm x 10cm black, stable, dry  Eschar Right elbow:  2cm x 3cm x 0.1cm skin tear (avulsed): pink moist wound bed with no exudate. Left heel:  Blanching, bogginess measuring 4cm x 3cm Wound bed: As described above Drainage (amount, consistency, odor) No to scant serous exudate from all wounds. Periwound: dry, intact. Dressing procedure/placement/frequency: Patient is on a mattress replacement with loss air loss feature as that is the standard bed in the ICU.  I have provided Nursing with guidance to order a similiar mattress when transfer orders are received to move patient to the medical-surgical floor. Patient is wearing what appears to be new pressure redistribution heel boots from the facility/SNF.  These are of good quality and we will not need to replace unless they become soiled.  Nursing will need to watch the right heel boot since patient has severe contracture that places the right heel very close to the rectum and patient is incontinence of feces. Right foot with severe arterial insufficiency with necrosis/eschar noted at the right lateral and medial foot.  The right lateral malleolus presents with an Unstageable  pressure injury for which I will recommend an enzymatic debriding agent (Collagenase) Santyl.  The left IT presents with an Unstageable PrI, collagenase is provided for this area, too. Skin tears and areas of risk (left heel, right elbow, rib, left heel) are padded with soft silicone foam dressings and routine turning and repositioning in addition to incontinence care are keeping the patient repositioned frequently. House skin care products are being used for incontinence care. WOC nursing team will not follow, but will remain available to this patient, the nursing and medical teams.  Please re-consult if needed. Thanks, Ladona Mow, MSN, RN, GNP, [place]  Pager# [phone]

## 2016-11-16 NOTE — Care Management Note (Signed)
Case Management Note  Patient Details  Name: Traci Martin MRN: [id number] Date of Birth: Jun 25, 1924  Subjective/Objective:   Adm with Afib with RVR. From [facility]. Active with [facility]. Facility working on DNR (needs to signatures- Attending and PMT aware). CSW aware of admission.              Action/Plan: Anticipate DC back to [facility] with continuation of [employer].   Expected Discharge Date:       11/18/2016           Expected Discharge Plan:  Skilled Nursing Facility  In-House Referral:  Clinical Social Work  Discharge planning Services  CM Consult  Post Acute Care Choice:    Choice offered to:     DME Arranged:    DME Agency:     HH Arranged:    HH Agency:     Status of Service:  Completed, signed off  If discussed at [employer], dates discussed:    Additional Comments:  Rivan Siordia, Chrystine Oiler, RN 11/16/2016, 11:38 AM

## 2016-11-16 NOTE — Progress Notes (Signed)
Nutrition Brief Note  RD was consulted for nutritional assessment.   Wt Readings from Last 15 Encounters:  11/16/16 72 lb 5 oz (32.8 kg)  01/10/16 94 lb 9.2 oz (42.9 kg)  01/08/14 72 lb (32.7 kg)  09/19/13 100 lb (45.4 kg)  08/26/13 100 lb (45.4 kg)  12/05/12 97 lb (44 kg)  10/28/12 97 lb (44 kg)  03/14/12 97 lb 7.1 oz (44.2 kg)   Body mass index is 13.23 kg/m. Patient meets criteria for underweight  based on current BMI.   Per discussion w/ nursing, SLP and CSW, patient is active with hospice. Therefore, goal is for comfort. RD met with patient and attempted what she would like to eat/drink if anything. She reported wanting nothing.   Nursing reports the patient does like to sip/eat bites "for the taste".   Current diet order is d2, tl.   Would recommend continuing to offer supplements. She already has Ensure ordered. RD will also place a magic cup on her tray.   . If nutrition issues arise, please consult RD.   Burtis Junes RD, LDN, CNSC Clinical Nutrition Pager: [phone] 11/16/2016 3:03 PM

## 2016-11-16 NOTE — Progress Notes (Signed)
PROGRESS NOTE                                                                                                                                                                                                             Patient Demographics:    Traci Martin, is a 83 y.o. female, DOB - 1924/05/14, ZOX:[id number]  Admit date - 11/15/2016   Admitting Physician Catarina Hartshorn, MD  Outpatient Primary MD for the patient is Charlynne Pander, MD  LOS - 1  Outpatient Specialists:None  Chief Complaint  Patient presents with  . Fall       Brief Narrative   83 year old female with history of cerebral palsy, cognitive impairment and severe contractures, A. fib, history of CHF, severely malnourished who was found down on the floor at the nursing facility and was sent to the ED. In the ED she went into rapid A. fib with heart rate up to 150s. She was also found to be hypothermic with temperature of 95.69F and hypotensive with blood pressure of 75/49 mmHg. Patient was given monitored normal saline bolus after which her blood pressure improved and was placed on Cardizem drip and admitted to stepdown unit.   Subjective:   Patient is unable to provide any history. Overnight blood pressure has been better but still requiring Cardizem drip.   Assessment  & Plan :   Principal problem Atrial fibrillation with RVR  On Cardizem drip which I will titrate to discontinue. Patient is resident of [facility] and actively followed by hospice there. Palliative care consult appreciated who discussed with DSS social work today. She is under the guardianship since February this year. It was confirmed that she was a DO NOT RESUSCITATE and ordered in her chart.  Her overall condition is extremely guarded with severe deconditioning, severe protein calorie malnutrition contractures and varus pressure injuries. The ultimate goal would be to transition her to medical  floor off Cardizem drip and discharge her to the facility under hospice care with goal for full comfort. If for current condition deteriorates I would not escalate her care and plan on comfort measures.  Hypothermia  Blood cultures sent. UA negative. On empiric Rocephin pending cultures. TSH and a.m. cortisol are normal.  Severe protein calorie malnutrition Nutrition consult  Essential hypertension Blood pressure medications held due to low blood pressure.  Pressure injuries  of the skin Patient has numerous pressure injuries in her right foot, right elbow, left heel, left ischial tuberosity, left trochanter head, left shoulder and left rib. Wound care consult appreciated.  Hypokalemia Replenished.  Code Status : DO NOT RESUSCITATE  Family Communication  : We'll contact her niece and updated  Disposition Plan  : Return to SNF with hospice  Barriers For Discharge : Active symptoms  Consults  :  Palliative care  Procedures  : None  DVT Prophylaxis  :  Lovenox -   Lab Results  Component Value Date   PLT 254 11/16/2016    Antibiotics  :    Anti-infectives    None        Objective:   Vitals:   11/16/16 1345 11/16/16 1400 11/16/16 1415 11/16/16 1430  BP: 126/70 124/61 117/60 119/60  Pulse: (!) 108 99 98 84  Resp:      Temp: 97.9 F (36.6 C) 97.7 F (36.5 C) 97.7 F (36.5 C) (!) 97.5 F (36.4 C)  TempSrc:      SpO2: 95% 95% 95% 96%  Weight:        Wt Readings from Last 3 Encounters:  11/16/16 32.8 kg (72 lb 5 oz)  01/10/16 42.9 kg (94 lb 9.2 oz)  01/08/14 32.7 kg (72 lb)     Intake/Output Summary (Last 24 hours) at 11/16/16 1702 Last data filed at 11/16/16 1200  Gross per 24 hour  Intake              833 ml  Output              245 ml  Net              588 ml     Physical Exam  Gen: Appears fatigued, contracted, HEENT: Dry mucosa, supple neck Chest: clear b/l, no added sounds CVS: S1&S2irregularly irregular GI: soft, NT, ND, BS+, chronic  Foley Musculoskeletal: warm, no edema, multiple pressure injuries and contractures. CNS: AAOX0    Data Review:    CBC  Recent Labs Lab 11/15/16 1020 11/16/16 0304  WBC 4.8 6.2  HGB 12.0 10.4*  HCT 36.2 31.9*  PLT 249 254  MCV 83.6 85.1  MCH 27.7 27.7  MCHC 33.1 32.6  RDW 16.7* 16.7*  LYMPHSABS 1.2  --   MONOABS 0.4  --   EOSABS 0.0  --   BASOSABS 0.0  --     Chemistries   Recent Labs Lab 11/15/16 1020 11/16/16 0304  NA 145 143  K 3.6 3.4*  CL 111 116*  CO2 23 21*  GLUCOSE 122* 96  BUN 29* 22*  CREATININE 0.83 0.65  CALCIUM 9.5 8.7*  MG 2.0 1.8  AST 30  --   ALT 19  --   ALKPHOS 91  --   BILITOT 0.7  --    ------------------------------------------------------------------------------------------------------------------ No results for input(s): CHOL, HDL, LDLCALC, TRIG, CHOLHDL, LDLDIRECT in the last 72 hours.  No results found for: HGBA1C ------------------------------------------------------------------------------------------------------------------  Recent Labs  11/15/16 1924  TSH 3.123   ------------------------------------------------------------------------------------------------------------------ No results for input(s): VITAMINB12, FOLATE, FERRITIN, TIBC, IRON, RETICCTPCT in the last 72 hours.  Coagulation profile No results for input(s): INR, PROTIME in the last 168 hours.  No results for input(s): DDIMER in the last 72 hours.  Cardiac Enzymes  Recent Labs Lab 11/15/16 1935 11/16/16 0304 11/16/16 0752  TROPONINI 0.04* 0.03* <0.03   ------------------------------------------------------------------------------------------------------------------ No results found for: BNP  Inpatient Medications  Scheduled Meds: . aspirin EC  81 mg Oral Daily  . clotrimazole  10 mg Oral 5 X Daily  . [START ON 11/17/2016] collagenase   Topical Daily  . enoxaparin (LOVENOX) injection  40 mg Subcutaneous Q24H  . feeding supplement  1 Container  Oral TID BM  . mirtazapine  15 mg Oral QHS  . mometasone-formoterol  2 puff Inhalation BID  . pantoprazole  40 mg Oral Daily  . potassium chloride SA  20 mEq Oral Daily  . senna-docusate  1 tablet Oral BID   Continuous Infusions: . 0.9 % NaCl with KCl 20 mEq / L 75 mL/hr at 11/16/16 1538  . diltiazem (CARDIZEM) infusion Stopped (11/16/16 1000)   PRN Meds:.acetaminophen, albuterol, bisacodyl, ipratropium-albuterol, ondansetron **OR** ondansetron (ZOFRAN) IV  Micro Results Recent Results (from the past 240 hour(s))  Urine culture     Status: None   Collection Time: 11/15/16  9:39 AM  Result Value Ref Range Status   Specimen Description URINE, CATHETERIZED  Final   Special Requests NONE  Final   Culture   Final    NO GROWTH Performed at [facility], [address], [place], [place] [address]    Report Status 11/16/2016 FINAL  Final  Culture, blood (Routine X 2) w Reflex to ID Panel     Status: None (Preliminary result)   Collection Time: 11/15/16  7:24 PM  Result Value Ref Range Status   Specimen Description BLOOD RIGHT ARM  Final   Special Requests   Final    BOTTLES DRAWN AEROBIC AND ANAEROBIC Blood Culture results may not be optimal due to an inadequate volume of blood received in culture bottles   Culture NO GROWTH < 12 HOURS  Final   Report Status PENDING  Incomplete  Culture, blood (Routine X 2) w Reflex to ID Panel     Status: None (Preliminary result)   Collection Time: 11/15/16  7:35 PM  Result Value Ref Range Status   Specimen Description BLOOD RIGHT HAND  Final   Special Requests   Final    BOTTLES DRAWN AEROBIC ONLY Blood Culture results may not be optimal due to an inadequate volume of blood received in culture bottles   Culture NO GROWTH < 12 HOURS  Final   Report Status PENDING  Incomplete    Radiology Reports Dg Chest Port 1 View  Result Date: 11/15/2016 CLINICAL DATA:  Atrial fibrillation EXAM: PORTABLE CHEST 1 VIEW COMPARISON:  01/10/2016 FINDINGS:  Examination is limited by habitus and positioning. The right lung is grossly clear. There is streaky atelectasis at the left base with probable elevation of the left diaphragm. Cardiomegaly with aortic atherosclerosis. IMPRESSION: 1. Elevated left diaphragm with streaky atelectasis at the left base. 2. Suspect that there is cardiomegaly when allowing for rotation and positioning. Electronically Signed   By: Jasmine Pang M.D.   On: 11/15/2016 20:59    Time Spent in minutes  35   Eddie North M.D on 11/16/2016 at 5:02 PM  Between 7am to 7pm - Pager - [phone]  After 7pm go to [email or web address] - password [facility]  [facility] -  Office  [phone]

## 2016-11-16 NOTE — Evaluation (Signed)
Clinical/Bedside Swallow Evaluation Patient Details  Name: Traci Martin MRN: [id number] Date of Birth: 07/19/1924  Today's Date: 11/16/2016 Time: SLP Start Time (ACUTE ONLY): 0900 SLP Stop Time (ACUTE ONLY): 0935 SLP Time Calculation (min) (ACUTE ONLY): 35 min  Past Medical History:  Past Medical History:  Diagnosis Date  . Arthritis   . Atrial fibrillation ([place])   . Cerebral palsy ([place])   . CHF (congestive heart failure) ([place])   . Dysphagia   . Frequent falls   . GERD (gastroesophageal reflux disease)   . History of pelvic fracture   . Hyperlipidemia   . Hypertension   . Muscle weakness   . Osteoporosis   . Polio   . Pressure ulcer   . Protein calorie malnutrition ([place])   . Stroke [facility])    Past Surgical History:  Past Surgical History:  Procedure Laterality Date  . ESOPHAGOGASTRODUODENOSCOPY N/A 12/05/2012   Procedure: ESOPHAGOGASTRODUODENOSCOPY (EGD);  Surgeon: Rogene Houston, MD;  Location: [facility] ENDO [facility]ITE;  Service: Endoscopy;  Laterality: N/A;  . HIP PINNING     HPI:  Traci Borys Jeffressis a 83 y.o.femalewith medical history of "cerebal palsy"/cognitive impairment was brought to the emergency department where she was found on the floor at the bedside at her nursing home from a presumable unwitnessed fall. According to the nursing home staff, the patient was at her mental baseline and awake and alert. Secondary to the patient's cognitive impairment, she is not able to provide any significant history. However, she denies any fevers, chills, chest pain, shortness breath, abdominal pain, headache. The remainder of the review of systems unobtainable secondary to patient's cognitive impairment. In the emergency department, the patient developed atrial fibrillation with RVR with heart rate up to 150. The patient subsequently was noted to be hypothermic with a temperature of 95.82F. She became hypotensive down to 75/49. The patient was given 1 L of normal saline with improvement of  her blood pressure. She was started on diltiazem drip after a bolus, but she remained in RVR. As a result, the patient was made for further evaluation. Chest x-ray shows: streaky atelectasis at the left base. BSE ordered.   Assessment / Plan / Recommendation Clinical Impression  Clinical swallow evaluation completed at bedside after RN assisted with repositioning Pt. Pt extremely contracted (legs and arms), but able to be postioned to semi reclined position. Oral cavity reveals coated tongue. Oral care completed using soft brushing of tongue. Pt verbally requested "water" throughout the evaluation. Volitional cough is weak. Pt without overt signs of decreased airway protection during spoon, cup, and straw sips thin water and Breeze. Puree trials yielded reduced lingual movement and lingual residuals post swallow. Baseline diet is unknown, however Pt states that she enjoys food that she can "chew" and requested "nabs". Pt assessed with small bites of graham cracker, but needed assist of puree to help soften. Lingual residuals noted with cracker trials (as well as with puree). Recommend D2/chopped and thin liquids when Pt is alert and upright with 100% feeder assist. Oral care is prudent to help clear oral cavity after meals and minimize risk for developing aspiration PNA. SLP left oral swabs in room, please swab mouth after meals to clear our any solid food residuals. Recommend RD consult to help establish nutritional needs as I suspect most caloric needs will be better met via liquids due to increased efficiency, however would also offer D2 and pureed options. SLP will follow during acute stay.  SLP Visit Diagnosis: Dysphagia, oropharyngeal phase (  R13.12)    Aspiration Risk  Mild aspiration risk;Risk for inadequate nutrition/hydration    Diet Recommendation Dysphagia 2 (Fine chop);Thin liquid (Will need to offer plenty of puree and liquid options )   Liquid Administration via: Straw Medication  Administration: Crushed with puree Supervision: Staff to assist with self feeding;Full supervision/cueing for compensatory strategies Compensations: Small sips/bites;Slow rate;Lingual sweep for clearance of pocketing;Follow solids with liquid (Will need to use swabs or toothbrush to clear oral cavity) Postural Changes: Remain upright for at least 30 minutes after po intake;Seated upright at 90 degrees (as upright as possible)    Other  Recommendations Oral Care Recommendations: Oral care QID;Staff/trained caregiver to provide oral care;Oral care before and after PO Other Recommendations: Clarify dietary restrictions   Follow up Recommendations Skilled Nursing facility;24 hour supervision/assistance      Frequency and Duration min 2x/week  1 week       Prognosis Prognosis for Safe Diet Advancement: Fair Barriers to Reach Goals: Severity of deficits      Swallow Study   General Date of Onset: 11/15/16 HPI: Traci Kleinschmidt Jeffressis a 83 y.o.femalewith medical history of "cerebal palsy"/cognitive impairment was brought to the emergency department where she was found on the floor at the bedside at her nursing home from a presumable unwitnessed fall. According to the nursing home staff, the patient was at her mental baseline and awake and alert. Secondary to the patient's cognitive impairment, she is not able to provide any significant history. However, she denies any fevers, chills, chest pain, shortness breath, abdominal pain, headache. The remainder of the review of systems unobtainable secondary to patient's cognitive impairment. In the emergency department, the patient developed atrial fibrillation with RVR with heart rate up to 150. The patient subsequently was noted to be hypothermic with a temperature of 95.43F. She became hypotensive down to 75/49. The patient was given 1 L of normal saline with improvement of her blood pressure. She was started on diltiazem drip after a bolus, but she  remained in RVR. As a result, the patient was made for further evaluation. Chest x-ray shows: streaky atelectasis at the left base. BSE ordered. Type of Study: Bedside Swallow Evaluation Previous Swallow Assessment: November 2017 D1/NTL Diet Prior to this Study: Dysphagia 3 (soft);Thin liquids Temperature Spikes Noted: No Respiratory Status: Room air History of Recent Intubation: No Behavior/Cognition: Alert;Cooperative;Pleasant mood Oral Cavity Assessment: Other (comment) (coated tongue) Oral Care Completed by SLP: Yes Oral Cavity - Dentition: Edentulous Vision: Impaired for self-feeding Self-Feeding Abilities: Total assist Patient Positioning: Upright in bed (Pt very contracted) Baseline Vocal Quality: Normal;Low vocal intensity Volitional Cough: Weak Volitional Swallow: Able to elicit    Oral/Motor/Sensory Function Overall Oral Motor/Sensory Function: Generalized oral weakness   Ice Chips Ice chips: Within functional limits Presentation: Spoon   Thin Liquid Thin Liquid: Within functional limits Presentation: Cup;Spoon;Straw    Nectar Thick Nectar Thick Liquid: Not tested   Honey Thick Honey Thick Liquid: Not tested   Puree Puree: Impaired Presentation: Spoon Oral Phase Impairments: Reduced lingual movement/coordination Oral Phase Functional Implications: Prolonged oral transit;Oral residue   Solid   GO   Solid: Impaired Presentation: Spoon Oral Phase Impairments: Reduced lingual movement/coordination;Impaired mastication Oral Phase Functional Implications: Prolonged oral transit;Impaired mastication;Oral residue Pharyngeal Phase Impairments: Cough - Delayed       Thank you,  Genene Churn, [place]  Danette Weinfeld 11/16/2016,10:02 AM

## 2016-11-17 DIAGNOSIS — R627 Adult failure to thrive: Secondary | ICD-10-CM

## 2016-11-17 DIAGNOSIS — R29898 Other symptoms and signs involving the musculoskeletal system: Secondary | ICD-10-CM | POA: Diagnosis present

## 2016-11-17 DIAGNOSIS — E43 Unspecified severe protein-calorie malnutrition: Secondary | ICD-10-CM | POA: Diagnosis present

## 2016-11-17 DIAGNOSIS — L89600 Pressure ulcer of unspecified heel, unstageable: Secondary | ICD-10-CM

## 2016-11-17 DIAGNOSIS — Z7189 Other specified counseling: Secondary | ICD-10-CM

## 2016-11-17 DIAGNOSIS — Z515 Encounter for palliative care: Secondary | ICD-10-CM

## 2016-11-17 DIAGNOSIS — T68XXXA Hypothermia, initial encounter: Secondary | ICD-10-CM | POA: Diagnosis present

## 2016-11-17 MED ORDER — POLYVINYL ALCOHOL 1.4 % OP SOLN
2.0000 [drp] | OPHTHALMIC | Status: DC | PRN
  Filled 2016-11-17: qty 15

## 2016-11-17 NOTE — Care Management (Signed)
During conversations with Rodney Booze, Palliative NP and attending, Dr. Gonzella Lex, DSS who has gaurdianship of patient, has elected to proceed with comfort care. Patient will transfer back to [facility] today to continue with Hospice services provided by [facility]. CM discussed with Leonette Most, Rep for [employer], he will visit [facility] today and discuss with facility comfort care measures and [facility] RN will advise staff on what to expect and how to manage patient. Patient is a DNR. CSW notified.

## 2016-11-17 NOTE — Discharge Summary (Signed)
Physician Discharge Summary  Traci Martin ZOX:[id number] DOB: 07/17/1924 DOA: 11/15/2016  PCP: Charlynne Pander, MD  Admit date: 11/15/2016 Discharge date: 11/17/2016  Admitted From: SNF Disposition:  SNF with hospice  Recommendations for Outpatient Follow-up:  1. Follow-up with hospice at [facility]. Will follow full comfort. 2. Has poor prognosis of few weeks survival 3. Goal is for full comfort measures with further aggressive treatments including IV antibiotics, IV fluids, artificial feeding or rehospitalization.   Equipment/Devices: Oxygen via nasal cannula  Discharge Condition : guarded CODE STATUS: DO NOT RESUSCITATE Diet recommendation: Comfort feeds   Discharge Diagnoses:  Principal problem   Atrial fibrillation with rapid ventricular response (HCC)   Active Problems:  SIRS   Hypertension   Cerebral palsy (HCC)   Dysphagia, pharyngoesophageal phase   Goals of care, counseling/discussion   Palliative care encounter   Pressure injury of skin   Severe protein-calorie malnutrition (HCC)   Hypothermia   FTT (failure to thrive) in adult   Severe muscle deconditioning  Brief narrative/history of present illness 83 year old female with history of cerebral palsy, cognitive impairment and severe contractures, A. fib, history of CHF, severely malnourished who was found down on the floor at the nursing facility and was sent to the ED. In the ED she went into rapid A. fib with heart rate up to 150s. She was also found to be hypothermic with temperature of 95.13F and hypotensive with blood pressure of 75/49 mmHg. Patient was given monitored normal saline bolus after which her blood pressure improved and was placed on Cardizem drip and admitted to stepdown unit.  Hospital course   Principal problem Atrial fibrillation with RVR  Patient admitted to stepdown unit requiring Cardizem drip.. Patient is resident of [facility] and actively followed by hospice there. Palliative care consult  appreciated who discussed with DSS social work. She is under the guardianship since February this year. It was confirmed that she is a DO NOT RESUSCITATE and ordered in her chart.  Her overall condition is extremely guarded with severe deconditioning, severe protein calorie malnutrition contractures and multiple pressure injuries. Her overall prognosis is extremely poor and upon discussing with the social work at the facility he was agreed that patient would benefit from being on full comfort measures, with goal on focusing on symptom management, comfort, no aggressive measures including unnecessary antibiotics, artificial feeding artery hospitalization.  No aggressive hospital inpatient care needed and patient can be discharged to SNF with hospice care for comfort.  SIRS with Hypothermia  Cultures negative. No clinical signs of infection. TSH and a.m. cortisol were normal.  Severe protein calorie malnutrition Nutrition consult appreciated. Added supplement.  Essential hypertension Blood pressure medications held due to low blood pressure. Now improved. Will resume her Lasix and Cardizem. Discontinue ACE inhibitor.  Pressure injuries of the skin Patient has numerous pressure injuries in her right foot, right elbow, left heel, left ischial tuberosity, left trochanter head, left shoulder and left rib. Wound care consult appreciated.  Hypokalemia Replenished.   Family Communication  :  no family present  Disposition Plan  : Return to SNF with hospice    Consults  :  Palliative care  Procedures  : None   Discharge Instructions   Allergies as of 11/17/2016   No Known Allergies     Medication List    STOP taking these medications   doxycycline 100 MG tablet Commonly known as:  VIBRA-TABS enalapril 2.5 MG tablet Commonly known as:  VASOTEC      TAKE  these medications   acetaminophen 500 MG tablet Commonly known as:  TYLENOL Take 1,000 mg by mouth every 6  (six) hours as needed for moderate pain.   albuterol 108 (90 Base) MCG/ACT inhaler Commonly known as:  PROVENTIL HFA;VENTOLIN HFA Inhale 1-2 puffs into the lungs every 6 (six) hours as needed for wheezing.   aspirin EC 81 MG tablet Take 1 tablet (81 mg total) by mouth daily.   bisacodyl 10 MG suppository Commonly known as:  DULCOLAX Place 10 mg rectally as needed for moderate constipation.   diltiazem 60 MG tablet Commonly known as:  CARDIZEM Take 60 mg by mouth 2 (two) times daily.     feeding supplement Liqd Take 1 Container by mouth 3 (three) times daily between meals.   furosemide 40 MG tablet Commonly known as:  LASIX Take 40 mg by mouth daily.   ipratropium-albuterol 0.5-2.5 (3) MG/3ML Soln Commonly known as:  DUONEB Take 3 mLs by nebulization every 4 (four) hours as needed (shortness of breath).   mirtazapine 15 MG tablet Commonly known as:  REMERON Take 1 tablet by mouth at bedtime.   omeprazole 20 MG capsule Commonly known as:  PRILOSEC Take 1 capsule by mouth at bedtime.   potassium chloride SA 20 MEQ tablet Commonly known as:  K-DUR,KLOR-CON Take 20 mEq by mouth daily.   senna-docusate 8.6-50 MG tablet Commonly known as:  Senokot-S Take 1 tablet by mouth 2 (two) times daily.   SYMBICORT 160-4.5 MCG/ACT inhaler Generic drug:  budesonide-formoterol Inhale 2 puffs into the lungs 2 (two) times daily.      Follow-up Information    hospice at SNF Follow up.          No Known Allergies      Procedures/Studies: Dg Chest Port 1 View  Result Date: 11/15/2016 CLINICAL DATA:  Atrial fibrillation EXAM: PORTABLE CHEST 1 VIEW COMPARISON:  01/10/2016 FINDINGS: Examination is limited by habitus and positioning. The right lung is grossly clear. There is streaky atelectasis at the left base with probable elevation of the left diaphragm. Cardiomegaly with aortic atherosclerosis. IMPRESSION: 1. Elevated left diaphragm with streaky atelectasis at the left base.  2. Suspect that there is cardiomegaly when allowing for rotation and positioning. Electronically Signed   By: Jasmine Pang M.D.   On: 11/15/2016 20:59       Subjective: Required Cardizem drip overnight she went back to rapid A. fib. Patient awake and knows that she is in [facility].  Discharge Exam: Vitals:   11/17/16 1000 11/17/16 1113  BP:    Pulse: 94 91  Resp: 20 (!) 22  Temp: 98.6 F (37 C) 98.6 F (37 C)  SpO2: 92% 91%   Vitals:   11/17/16 0810 11/17/16 0900 11/17/16 1000 11/17/16 1113  BP:      Pulse:  100 94 91  Resp:  (!) 27 20 (!) 22  Temp:  98.6 F (37 C) 98.6 F (37 C) 98.6 F (37 C)  TempSrc:      SpO2: 92% 96% 92% 91%  Weight:        Gen: Appears fatigued, contracted, HEENT: Dry mucosa, supple neck Chest: clear b/l, no added sounds CVS: S1&S2irregularly irregular GI: soft, NT, ND, BS+, chronic Foley Musculoskeletal: warm, no edema, multiple pressure injuries and contractures. CNS: AAOX0      The results of significant diagnostics from this hospitalization (including imaging, microbiology, ancillary and laboratory) are listed below for reference.     Microbiology: Recent Results (from the past 240  hour(s))  Urine culture     Status: None   Collection Time: 11/15/16  9:39 AM  Result Value Ref Range Status   Specimen Description URINE, CATHETERIZED  Final   Special Requests NONE  Final   Culture   Final    NO GROWTH Performed at [facility], [address], [place], [place] [address]    Report Status 11/16/2016 FINAL  Final  Culture, blood (Routine X 2) w Reflex to ID Panel     Status: None (Preliminary result)   Collection Time: 11/15/16  7:24 PM  Result Value Ref Range Status   Specimen Description BLOOD RIGHT ARM  Final   Special Requests   Final    BOTTLES DRAWN AEROBIC AND ANAEROBIC Blood Culture results may not be optimal due to an inadequate volume of blood received in culture bottles   Culture NO GROWTH 2 DAYS  Final    Report Status PENDING  Incomplete  Culture, blood (Routine X 2) w Reflex to ID Panel     Status: None (Preliminary result)   Collection Time: 11/15/16  7:35 PM  Result Value Ref Range Status   Specimen Description BLOOD RIGHT HAND  Final   Special Requests   Final    BOTTLES DRAWN AEROBIC ONLY Blood Culture results may not be optimal due to an inadequate volume of blood received in culture bottles   Culture NO GROWTH 2 DAYS  Final   Report Status PENDING  Incomplete     Labs: BNP (last 3 results) No results for input(s): BNP in the last 8760 hours. Basic Metabolic Panel:  Recent Labs Lab 11/15/16 1020 11/15/16 1935 11/16/16 0304  NA 145  --  143  K 3.6  --  3.4*  CL 111  --  116*  CO2 23  --  21*  GLUCOSE 122*  --  96  BUN 29*  --  22*  CREATININE 0.83  --  0.65  CALCIUM 9.5  --  8.7*  MG 2.0  --  1.8  PHOS  --  2.5  --    Liver Function Tests:  Recent Labs Lab 11/15/16 1020  AST 30  ALT 19  ALKPHOS 91  BILITOT 0.7  PROT 6.4*  ALBUMIN 2.6*   No results for input(s): LIPASE, AMYLASE in the last 168 hours. No results for input(s): AMMONIA in the last 168 hours. CBC:  Recent Labs Lab 11/15/16 1020 11/16/16 0304  WBC 4.8 6.2  NEUTROABS 3.3  --   HGB 12.0 10.4*  HCT 36.2 31.9*  MCV 83.6 85.1  PLT 249 254   Cardiac Enzymes:  Recent Labs Lab 11/15/16 1935 11/16/16 0304 11/16/16 0752  TROPONINI 0.04* 0.03* <0.03   BNP: Invalid input(s): POCBNP CBG: No results for input(s): GLUCAP in the last 168 hours. D-Dimer No results for input(s): DDIMER in the last 72 hours. Hgb A1c No results for input(s): HGBA1C in the last 72 hours. Lipid Profile No results for input(s): CHOL, HDL, LDLCALC, TRIG, CHOLHDL, LDLDIRECT in the last 72 hours. Thyroid function studies  Recent Labs  11/15/16 1924  TSH 3.123   Anemia work up No results for input(s): VITAMINB12, FOLATE, FERRITIN, TIBC, IRON, RETICCTPCT in the last 72 hours. Urinalysis    Component Value  Date/Time   COLORURINE AMBER (A) 11/15/2016 0939   APPEARANCEUR HAZY (A) 11/15/2016 0939   LABSPEC 1.024 11/15/2016 0939   PHURINE 5.0 11/15/2016 0939   GLUCOSEU NEGATIVE 11/15/2016 0939   HGBUR LARGE (A) 11/15/2016 1914  BILIRUBINUR NEGATIVE 11/15/2016 0939   KETONESUR NEGATIVE 11/15/2016 0939   PROTEINUR NEGATIVE 11/15/2016 0939   UROBILINOGEN 0.2 03/09/2012 1215   NITRITE NEGATIVE 11/15/2016 0939   LEUKOCYTESUR NEGATIVE 11/15/2016 0939   Sepsis Labs Invalid input(s): PROCALCITONIN,  WBC,  LACTICIDVEN Microbiology Recent Results (from the past 240 hour(s))  Urine culture     Status: None   Collection Time: 11/15/16  9:39 AM  Result Value Ref Range Status   Specimen Description URINE, CATHETERIZED  Final   Special Requests NONE  Final   Culture   Final    NO GROWTH Performed at [facility], [address] [place], [place] [address]    Report Status 11/16/2016 FINAL  Final  Culture, blood (Routine X 2) w Reflex to ID Panel     Status: None (Preliminary result)   Collection Time: 11/15/16  7:24 PM  Result Value Ref Range Status   Specimen Description BLOOD RIGHT ARM  Final   Special Requests   Final    BOTTLES DRAWN AEROBIC AND ANAEROBIC Blood Culture results may not be optimal due to an inadequate volume of blood received in culture bottles   Culture NO GROWTH 2 DAYS  Final   Report Status PENDING  Incomplete  Culture, blood (Routine X 2) w Reflex to ID Panel     Status: None (Preliminary result)   Collection Time: 11/15/16  7:35 PM  Result Value Ref Range Status   Specimen Description BLOOD RIGHT HAND  Final   Special Requests   Final    BOTTLES DRAWN AEROBIC ONLY Blood Culture results may not be optimal due to an inadequate volume of blood received in culture bottles   Culture NO GROWTH 2 DAYS  Final   Report Status PENDING  Incomplete     Time coordinating discharge: Over 30 minutes  SIGNED:   Eddie North, MD  [facility] 11/17/2016, 1:06  PM Pager   If 7PM-7AM, please contact night-coverage [email or web address] Password [email or web address]

## 2016-11-17 NOTE — Clinical Social Work Note (Signed)
Clinical Social Work Assessment  Patient Details  Name: Traci Martin MRN: [id number] Date of Birth: September 18, 1924  Date of referral:  11/16/16               Reason for consult:  Discharge Planning                Permission sought to share information with:    Permission granted to share information::     Name::        Agency::  [facility] at [facility]  Relationship::     Contact Information:     Housing/Transportation Living arrangements for the past 2 months:  Skilled Building surveyor of Information:  Facility Patient Interpreter Needed:  None Criminal Activity/Legal Involvement Pertinent to Current Situation/Hospitalization:  No - Comment as needed Significant Relationships:  Other(Comment) (Patient is ward of [place] DSS [name] [phone])) Lives with:  Facility Resident Do you feel safe going back to the place where you live?  Yes Need for family participation in patient care:  Yes (Comment)  Care giving concerns:  Facility resident.    Social Worker assessment / plan: LCSW spoke with Debbie at [facility]. Patient is a long term resident at the facility. She is total care and is a [place] ward of the state. Patient can return at discharge.  LCSW notified Tammy and Eunice Blase of patient's discharge today.   LCSW spoke with Tobi Bastos, DSS guardian, and advised that patient was discharging today and that patient would continue with full comfort care and [facility] would follow patient's comfort care needs with the facility. She was agreeable.  LCSW signing off.   Employment status:  Retired Database administrator, [employer] [place] PT Recommendations:  Not assessed at this time Information / Referral to community resources:     Patient/Family's Response to care: Guardian has been informed of patient's condition by palliative care team.   Patient/Family's Understanding of and Emotional Response to Diagnosis, Current Treatment, and Prognosis:  Guardian  understands patient's diagnosis, treatment and prognosis and will be making patient full comfort care upon return to the facility.   Emotional Assessment Appearance:  Appears stated age Attitude/Demeanor/Rapport:    Affect (typically observed):  Unable to Assess Orientation:  Oriented to Self Alcohol / Substance use:  Not Applicable Psych involvement (Current and /or in the community):  No (Comment)  Discharge Needs  Concerns to be addressed:  Discharge Planning Concerns Readmission within the last 30 days:  No Current discharge risk:  None Barriers to Discharge:  No Barriers Identified   Annice Needy, LCSW 11/17/2016, 12:01 PM

## 2016-11-17 NOTE — Progress Notes (Signed)
Daily Progress Note   Patient Name: Traci Martin       Date: 11/17/2016 DOB: 09-17-1924  Age: 83 y.o. MRN#: [id number] Attending Physician: Eddie North, MD Primary Care Physician: Charlynne Pander, MD Admit Date: 11/15/2016  Reason for Consultation/Follow-up: Establishing goals of care, Hospice Evaluation and Psychosocial/spiritual support  Subjective: Traci Martin is resting quietly in bed. She is able to make but not keep eye contact. She has no complaints at this time, and asks for a cookie. I give boost peach resource drink which is able to take without overt signs and symptoms of aspiration. No family at bedside at this time.  Call to DSS social worker, [name]. We discuss Traci Martin health condition and what is expected for her future. We discussed the benefits of hospice in place, staying at her residential SNF where she knows people, and is known. We discussed the him OST form in detail, and I complete this form to send with Traci Martin. We talk about IV fluids at end-of-life, I share the resources of the [place]  "fast facts" palliative care website. We talk about article number 313 which describes hydration and end-of-life. No further questions at this time, I encourage Tobi Bastos to call at any time.  Length of Stay: 2  Current Medications: Scheduled Meds:  . clotrimazole  10 mg Oral 5 X Daily  . collagenase   Topical Daily  . feeding supplement  1 Container Oral TID BM  . mirtazapine  15 mg Oral QHS  . mometasone-formoterol  2 puff Inhalation BID  . senna-docusate  1 tablet Oral BID    Continuous Infusions:   PRN Meds: acetaminophen, albuterol, bisacodyl, ipratropium-albuterol, ondansetron **OR** ondansetron (ZOFRAN) IV, polyvinyl alcohol  Physical Exam    Constitutional: No distress.  HENT:  Head: Atraumatic.  Cardiovascular: Normal rate.   Pulmonary/Chest: No respiratory distress.  Abdominal: Soft.  Musculoskeletal: She exhibits deformity. She exhibits no edema.  Neurological: She is alert.  Skin: Skin is warm and dry.  Nursing note and vitals reviewed.           Vital Signs: BP 133/63 (BP Location: Right Arm)   Pulse 91   Temp 98.6 F (37 C)   Resp (!) 22   Wt 33.8 kg (74 lb 8.3 oz)   SpO2 91%   BMI 13.63  kg/m  SpO2: SpO2: 91 % O2 Device: O2 Device: Not Delivered O2 Flow Rate:    Intake/output summary:  Intake/Output Summary (Last 24 hours) at 11/17/16 1247 Last data filed at 11/17/16 1000  Gross per 24 hour  Intake          1047.58 ml  Output              125 ml  Net           922.58 ml   LBM: Last BM Date: 11/16/16 Baseline Weight: Weight: 32.8 kg (72 lb 5 oz) Most recent weight: Weight: 33.8 kg (74 lb 8.3 oz)       Palliative Assessment/Data:    Flowsheet Rows     Most Recent Value  Intake Tab  Referral Department  Hospitalist  Unit at Time of Referral  ICU  Palliative Care Primary Diagnosis  Cardiac  Date Notified  11/15/16  Palliative Care Type  New Palliative care  Reason for referral  Clarify Goals of Care  Date of Admission  11/15/16  Date first seen by Palliative Care  11/16/16  # of days Palliative referral response time  1 Day(s)  # of days IP prior to Palliative referral  0  Clinical Assessment  Palliative Performance Scale Score  30%  Pain Max last 24 hours  Not able to report  Pain Min Last 24 hours  Not able to report  Dyspnea Max Last 24 Hours  Not able to report  Dyspnea Min Last 24 hours  Not able to report  Psychosocial & Spiritual Assessment  Palliative Care Outcomes  Patient/Family meeting held?  Yes  Who was at the meeting?  patient at bedside       Patient Active Problem List   Diagnosis Date Noted  . Pressure injury of skin 11/16/2016  . Goals of care,  counseling/discussion   . Palliative care encounter   . DNR (do not resuscitate) discussion   . Atrial fibrillation with rapid ventricular response (HCC) 11/15/2016  . Severe malnutrition (HCC) 11/15/2016  . CAP (community acquired pneumonia) 01/10/2016  . Dysphagia, pharyngoesophageal phase 01/08/2014  . Rhabdomyolysis 03/12/2012  . Atrial fibrillation (HCC) 03/12/2012  . Weakness 03/12/2012  . Dehydration 03/12/2012  . Hypertension 03/12/2012  . Cerebral palsy (HCC) 03/12/2012  . FRACTURE, PELVIS, LEFT 05/12/2010    Palliative Care Assessment & Plan   Patient Profile: 83 y.o. female  with past medical history of Atrial fibrillation, cerebral palsy, CHF, history of pelvic fracture, history of high blood pressure and increased cholesterol, in history of protein calorie malnutrition, that down and contracted admitted on 11/15/2016 with atrial fibrillation with RVR.   Assessment: atrial fibrillation with RVR: rate control, returning to residential SNF, [facility], with the benefits of [facility] hospice.  Recommendations/Plan:  full comfort care  MOST form completed  do not rehospitalized  Goals of Care and Additional Recommendations:  Limitations on Scope of Treatment: Full Comfort Care  Code Status:    Code Status Orders        Start     Ordered   11/16/16 1456  Do not attempt resuscitation (DNR)  Continuous    Question Answer Comment  In the event of cardiac or respiratory ARREST Do not call a "code blue"   In the event of cardiac or respiratory ARREST Do not perform Intubation, CPR, defibrillation or ACLS   In the event of cardiac or respiratory ARREST Use medication by any route, position, wound care, and other measures to relive  pain and suffering. May use oxygen, suction and manual treatment of airway obstruction as needed for comfort.      11/16/16 1455    Code Status History    Date Active Date Inactive Code Status Order ID Comments User Context   11/15/2016  6:55  PM 11/16/2016  2:55 PM Full Code [id number]  Catarina Hartshorn, MD Inpatient   01/10/2016  9:36 PM 01/14/2016  4:40 PM Full Code [id number]  Houston Siren, MD Inpatient   03/09/2012  5:46 PM 03/14/2012  3:26 PM Full Code [id number]  Carylon Perches, MD ED       Prognosis:   < 4 weeks, would not be surprising based on frailty, functional status, contractures, multiple wounds, nearing end-of-life.  Discharge Planning:  returning to residential SNF, [facility], with the benefits of [facility] hospice.  Care plan was discussed with nursing staff, case manager, social worker, and Dr. Barnett Abu.   Thank you for allowing the Palliative Medicine Team to assist in the care of this patient.   Time In: 1225  Time Out: 1300  Total Time 35 minutes  Prolonged Time Billed  no       Greater than 50%  of this time was spent counseling and coordinating care related to the above assessment and plan.  Katheran Awe, NP  Please contact Palliative Medicine Team phone at [phone] for questions and concerns.

## 2016-11-17 NOTE — Progress Notes (Signed)
Pt HR continues to stay high. Paged MD, He wants to start her back on the cardizem and changed her back to stepdown.  Lugenia Assefa Shelia Media, RN

## 2016-11-17 NOTE — [place]2 (Signed)
[place] MEDICAID [place]2 LEVEL OF CARE SCREENING TOOL     IDENTIFICATION  Patient Name: Traci Martin Birthdate: 1924/03/26 Sex: female Admission Date (Current Location): 11/15/2016  [place] and [place] Number:  Aaron Edelman [id number] K Facility and Address:  [facility],  [address], [place] [address]      Provider Number: [id number]  Attending Physician Name and Address:  Eddie North, MD  Relative Name and Phone Number:       Current Level of Care: Hospital Recommended Level of Care: Skilled Nursing Facility Prior Approval Number:    Date Approved/Denied:   PASRR Number:    Discharge Plan: SNF    Current Diagnoses: Patient Active Problem List   Diagnosis Date Noted  . Pressure injury of skin 11/16/2016  . Goals of care, counseling/discussion   . Palliative care encounter   . DNR (do not resuscitate) discussion   . Atrial fibrillation with rapid ventricular response (HCC) 11/15/2016  . Severe malnutrition (HCC) 11/15/2016  . CAP (community acquired pneumonia) 01/10/2016  . Dysphagia, pharyngoesophageal phase 01/08/2014  . Rhabdomyolysis 03/12/2012  . Atrial fibrillation (HCC) 03/12/2012  . Weakness 03/12/2012  . Dehydration 03/12/2012  . Hypertension 03/12/2012  . Cerebral palsy (HCC) 03/12/2012  . FRACTURE, PELVIS, LEFT 05/12/2010    Orientation RESPIRATION BLADDER Height & Weight     Self  Normal Incontinent Weight: 74 lb 8.3 oz (33.8 kg) Height:     BEHAVIORAL SYMPTOMS/MOOD NEUROLOGICAL BOWEL NUTRITION STATUS      Incontinent Diet (see discharge summary)  AMBULATORY STATUS COMMUNICATION OF NEEDS Skin   Total Care Verbally PU Stage and Appropriate Care, Other (Comment) (Unstagable: right, medial foot; right, lateral foot; right, lateral ankle; ) PU Stage 1 Dressing:  (Left hip, foam dressing PRN; left shoulder, foam dressing PRN.)   PU Stage 3 Dressing:  (left ischial tuberosity, foam dressing, PRN)                 Personal  Care Assistance Level of Assistance  Total care Bathing Assistance: Maximum assistance Feeding assistance: Maximum assistance   Total Care Assistance: Maximum assistance   Functional Limitations Info  Sight, Hearing, Speech Sight Info: Adequate Hearing Info: Adequate Speech Info: Adequate    SPECIAL CARE FACTORS FREQUENCY                       Contractures Contractures Info: Present    Additional Factors Info  Code Status, Psychotropic Code Status Info: DNR   Psychotropic Info: Remeron         Current Medications (11/17/2016):  This is the current hospital active medication list Current Facility-Administered Medications  Medication Dose Route Frequency Provider Last Rate Last Dose  . acetaminophen (TYLENOL) tablet 1,000 mg  1,000 mg Oral Q6H PRN Catarina Hartshorn, MD   1,000 mg at 11/16/16 0952  . albuterol (PROVENTIL) (2.5 MG/3ML) 0.083% nebulizer solution 3 mL  3 mL Inhalation Q6H PRN Tat, David, MD      . aspirin EC tablet 81 mg  81 mg Oral Daily Tat, David, MD   81 mg at 11/17/16 0853  . bisacodyl (DULCOLAX) suppository 10 mg  10 mg Rectal Daily PRN Tat, Onalee Hua, MD      . clotrimazole [facility]) troche 10 mg  10 mg Oral 5 X Daily Haydee Salter, MD   10 mg at 11/17/16 0854  . collagenase (SANTYL) ointment   Topical Daily Dhungel, Nishant, MD      . diltiazem (CARDIZEM) 100 mg in  dextrose 5% (1 mg/mL) infusion  5-15 mg/hr Intravenous Titrated Tat, Onalee Hua, MD 15 mL/hr at 11/17/16 1000 15 mg/hr at 11/17/16 1000  . enoxaparin (LOVENOX) injection 40 mg  40 mg Subcutaneous Q24H Catarina Hartshorn, MD   Stopped at 11/15/16 2200  . feeding supplement (BOOST HIGH PROTEIN) liquid 237 mL  1 Container Oral TID BM Catarina Hartshorn, MD   237 mL at 11/17/16 1014  . ipratropium-albuterol (DUONEB) 0.5-2.5 (3) MG/3ML nebulizer solution 3 mL  3 mL Nebulization Q4H PRN Tat, David, MD      . mirtazapine (REMERON) tablet 15 mg  15 mg Oral Maura Crandall, MD   15 mg at 11/16/16 2100  .  mometasone-formoterol (DULERA) 200-5 MCG/ACT inhaler 2 puff  2 puff Inhalation BID Catarina Hartshorn, MD   2 puff at 11/17/16 0809  . ondansetron (ZOFRAN) tablet 4 mg  4 mg Oral Q6H PRN Tat, David, MD       Or  . ondansetron (ZOFRAN) injection 4 mg  4 mg Intravenous Q6H PRN Tat, David, MD      . pantoprazole (PROTONIX) EC tablet 40 mg  40 mg Oral Daily Tat, David, MD   40 mg at 11/17/16 0853  . potassium chloride SA (K-DUR,KLOR-CON) CR tablet 20 mEq  20 mEq Oral Daily Tat, David, MD   20 mEq at 11/17/16 0853  . senna-docusate (Senokot-S) tablet 1 tablet  1 tablet Oral BID Catarina Hartshorn, MD   1 tablet at 11/17/16 6962     Discharge Medications: Please see discharge summary for a list of discharge medications.  Relevant Imaging Results:  Relevant Lab Results:   Additional Information    Shantinique Picazo, Juleen China, LCSW

## 2016-11-18 DIAGNOSIS — E43 Unspecified severe protein-calorie malnutrition: Secondary | ICD-10-CM | POA: Diagnosis not present

## 2016-11-18 DIAGNOSIS — J9610 Chronic respiratory failure, unspecified whether with hypoxia or hypercapnia: Secondary | ICD-10-CM | POA: Diagnosis not present

## 2016-11-18 DIAGNOSIS — G3184 Mild cognitive impairment, so stated: Secondary | ICD-10-CM | POA: Diagnosis not present

## 2016-11-18 DIAGNOSIS — R1314 Dysphagia, pharyngoesophageal phase: Secondary | ICD-10-CM | POA: Diagnosis not present

## 2016-11-18 DIAGNOSIS — I639 Cerebral infarction, unspecified: Secondary | ICD-10-CM | POA: Diagnosis not present

## 2016-11-18 DIAGNOSIS — Z515 Encounter for palliative care: Secondary | ICD-10-CM | POA: Diagnosis not present

## 2016-11-18 DIAGNOSIS — G809 Cerebral palsy, unspecified: Secondary | ICD-10-CM | POA: Diagnosis not present

## 2016-11-18 DIAGNOSIS — M6281 Muscle weakness (generalized): Secondary | ICD-10-CM | POA: Diagnosis not present

## 2016-11-19 DIAGNOSIS — J9610 Chronic respiratory failure, unspecified whether with hypoxia or hypercapnia: Secondary | ICD-10-CM | POA: Diagnosis not present

## 2016-11-19 DIAGNOSIS — G3184 Mild cognitive impairment, so stated: Secondary | ICD-10-CM | POA: Diagnosis not present

## 2016-11-19 DIAGNOSIS — Z515 Encounter for palliative care: Secondary | ICD-10-CM | POA: Diagnosis not present

## 2016-11-19 DIAGNOSIS — E43 Unspecified severe protein-calorie malnutrition: Secondary | ICD-10-CM | POA: Diagnosis not present

## 2016-11-19 DIAGNOSIS — I639 Cerebral infarction, unspecified: Secondary | ICD-10-CM | POA: Diagnosis not present

## 2016-11-19 DIAGNOSIS — R1314 Dysphagia, pharyngoesophageal phase: Secondary | ICD-10-CM | POA: Diagnosis not present

## 2016-11-19 DIAGNOSIS — M6281 Muscle weakness (generalized): Secondary | ICD-10-CM | POA: Diagnosis not present

## 2016-11-19 DIAGNOSIS — G809 Cerebral palsy, unspecified: Secondary | ICD-10-CM | POA: Diagnosis not present

## 2016-11-20 DIAGNOSIS — R1314 Dysphagia, pharyngoesophageal phase: Secondary | ICD-10-CM | POA: Diagnosis not present

## 2016-11-20 DIAGNOSIS — G809 Cerebral palsy, unspecified: Secondary | ICD-10-CM | POA: Diagnosis not present

## 2016-11-20 DIAGNOSIS — G3184 Mild cognitive impairment, so stated: Secondary | ICD-10-CM | POA: Diagnosis not present

## 2016-11-20 DIAGNOSIS — E43 Unspecified severe protein-calorie malnutrition: Secondary | ICD-10-CM | POA: Diagnosis not present

## 2016-11-20 DIAGNOSIS — M6281 Muscle weakness (generalized): Secondary | ICD-10-CM | POA: Diagnosis not present

## 2016-11-20 DIAGNOSIS — I639 Cerebral infarction, unspecified: Secondary | ICD-10-CM | POA: Diagnosis not present

## 2016-11-20 DIAGNOSIS — Z515 Encounter for palliative care: Secondary | ICD-10-CM | POA: Diagnosis not present

## 2016-11-20 DIAGNOSIS — J9610 Chronic respiratory failure, unspecified whether with hypoxia or hypercapnia: Secondary | ICD-10-CM | POA: Diagnosis not present

## 2016-11-20 LAB — CULTURE, BLOOD (ROUTINE X 2)
CULTURE: NO GROWTH
Culture: NO GROWTH

## 2016-11-21 DIAGNOSIS — J9610 Chronic respiratory failure, unspecified whether with hypoxia or hypercapnia: Secondary | ICD-10-CM | POA: Diagnosis not present

## 2016-11-21 DIAGNOSIS — M6281 Muscle weakness (generalized): Secondary | ICD-10-CM | POA: Diagnosis not present

## 2016-11-21 DIAGNOSIS — G3184 Mild cognitive impairment, so stated: Secondary | ICD-10-CM | POA: Diagnosis not present

## 2016-11-21 DIAGNOSIS — G809 Cerebral palsy, unspecified: Secondary | ICD-10-CM | POA: Diagnosis not present

## 2016-11-21 DIAGNOSIS — I639 Cerebral infarction, unspecified: Secondary | ICD-10-CM | POA: Diagnosis not present

## 2016-11-21 DIAGNOSIS — R1314 Dysphagia, pharyngoesophageal phase: Secondary | ICD-10-CM | POA: Diagnosis not present

## 2016-11-21 DIAGNOSIS — Z515 Encounter for palliative care: Secondary | ICD-10-CM | POA: Diagnosis not present

## 2016-11-21 DIAGNOSIS — I4891 Unspecified atrial fibrillation: Secondary | ICD-10-CM | POA: Diagnosis not present

## 2016-11-21 DIAGNOSIS — E43 Unspecified severe protein-calorie malnutrition: Secondary | ICD-10-CM | POA: Diagnosis not present

## 2016-11-22 DIAGNOSIS — G3184 Mild cognitive impairment, so stated: Secondary | ICD-10-CM | POA: Diagnosis not present

## 2016-11-22 DIAGNOSIS — R1314 Dysphagia, pharyngoesophageal phase: Secondary | ICD-10-CM | POA: Diagnosis not present

## 2016-11-22 DIAGNOSIS — G809 Cerebral palsy, unspecified: Secondary | ICD-10-CM | POA: Diagnosis not present

## 2016-11-22 DIAGNOSIS — M6281 Muscle weakness (generalized): Secondary | ICD-10-CM | POA: Diagnosis not present

## 2016-11-22 DIAGNOSIS — I639 Cerebral infarction, unspecified: Secondary | ICD-10-CM | POA: Diagnosis not present

## 2016-11-22 DIAGNOSIS — J9610 Chronic respiratory failure, unspecified whether with hypoxia or hypercapnia: Secondary | ICD-10-CM | POA: Diagnosis not present

## 2016-11-22 DIAGNOSIS — E43 Unspecified severe protein-calorie malnutrition: Secondary | ICD-10-CM | POA: Diagnosis not present

## 2016-11-22 DIAGNOSIS — Z515 Encounter for palliative care: Secondary | ICD-10-CM | POA: Diagnosis not present

## 2016-11-23 DIAGNOSIS — J9610 Chronic respiratory failure, unspecified whether with hypoxia or hypercapnia: Secondary | ICD-10-CM | POA: Diagnosis not present

## 2016-11-23 DIAGNOSIS — G809 Cerebral palsy, unspecified: Secondary | ICD-10-CM | POA: Diagnosis not present

## 2016-11-23 DIAGNOSIS — R1314 Dysphagia, pharyngoesophageal phase: Secondary | ICD-10-CM | POA: Diagnosis not present

## 2016-11-23 DIAGNOSIS — G3184 Mild cognitive impairment, so stated: Secondary | ICD-10-CM | POA: Diagnosis not present

## 2016-11-23 DIAGNOSIS — M6281 Muscle weakness (generalized): Secondary | ICD-10-CM | POA: Diagnosis not present

## 2016-11-23 DIAGNOSIS — E43 Unspecified severe protein-calorie malnutrition: Secondary | ICD-10-CM | POA: Diagnosis not present

## 2016-11-23 DIAGNOSIS — Z515 Encounter for palliative care: Secondary | ICD-10-CM | POA: Diagnosis not present

## 2016-11-23 DIAGNOSIS — I639 Cerebral infarction, unspecified: Secondary | ICD-10-CM | POA: Diagnosis not present

## 2016-11-24 DIAGNOSIS — M6281 Muscle weakness (generalized): Secondary | ICD-10-CM | POA: Diagnosis not present

## 2016-11-24 DIAGNOSIS — G3184 Mild cognitive impairment, so stated: Secondary | ICD-10-CM | POA: Diagnosis not present

## 2016-11-24 DIAGNOSIS — I4891 Unspecified atrial fibrillation: Secondary | ICD-10-CM | POA: Diagnosis not present

## 2016-11-24 DIAGNOSIS — Z515 Encounter for palliative care: Secondary | ICD-10-CM | POA: Diagnosis not present

## 2016-11-24 DIAGNOSIS — I639 Cerebral infarction, unspecified: Secondary | ICD-10-CM | POA: Diagnosis not present

## 2016-11-24 DIAGNOSIS — E43 Unspecified severe protein-calorie malnutrition: Secondary | ICD-10-CM | POA: Diagnosis not present

## 2016-11-24 DIAGNOSIS — R1314 Dysphagia, pharyngoesophageal phase: Secondary | ICD-10-CM | POA: Diagnosis not present

## 2016-11-24 DIAGNOSIS — G809 Cerebral palsy, unspecified: Secondary | ICD-10-CM | POA: Diagnosis not present

## 2016-11-24 DIAGNOSIS — J9610 Chronic respiratory failure, unspecified whether with hypoxia or hypercapnia: Secondary | ICD-10-CM | POA: Diagnosis not present

## 2016-11-25 DIAGNOSIS — G3184 Mild cognitive impairment, so stated: Secondary | ICD-10-CM | POA: Diagnosis not present

## 2016-11-25 DIAGNOSIS — R1314 Dysphagia, pharyngoesophageal phase: Secondary | ICD-10-CM | POA: Diagnosis not present

## 2016-11-25 DIAGNOSIS — J9610 Chronic respiratory failure, unspecified whether with hypoxia or hypercapnia: Secondary | ICD-10-CM | POA: Diagnosis not present

## 2016-11-25 DIAGNOSIS — I639 Cerebral infarction, unspecified: Secondary | ICD-10-CM | POA: Diagnosis not present

## 2016-11-25 DIAGNOSIS — M6281 Muscle weakness (generalized): Secondary | ICD-10-CM | POA: Diagnosis not present

## 2016-11-25 DIAGNOSIS — Z515 Encounter for palliative care: Secondary | ICD-10-CM | POA: Diagnosis not present

## 2016-11-25 DIAGNOSIS — E43 Unspecified severe protein-calorie malnutrition: Secondary | ICD-10-CM | POA: Diagnosis not present

## 2016-11-25 DIAGNOSIS — G809 Cerebral palsy, unspecified: Secondary | ICD-10-CM | POA: Diagnosis not present

## 2016-11-26 DIAGNOSIS — I639 Cerebral infarction, unspecified: Secondary | ICD-10-CM | POA: Diagnosis not present

## 2016-11-26 DIAGNOSIS — Z515 Encounter for palliative care: Secondary | ICD-10-CM | POA: Diagnosis not present

## 2016-11-26 DIAGNOSIS — M6281 Muscle weakness (generalized): Secondary | ICD-10-CM | POA: Diagnosis not present

## 2016-11-26 DIAGNOSIS — G809 Cerebral palsy, unspecified: Secondary | ICD-10-CM | POA: Diagnosis not present

## 2016-11-26 DIAGNOSIS — G3184 Mild cognitive impairment, so stated: Secondary | ICD-10-CM | POA: Diagnosis not present

## 2016-11-26 DIAGNOSIS — E43 Unspecified severe protein-calorie malnutrition: Secondary | ICD-10-CM | POA: Diagnosis not present

## 2016-11-26 DIAGNOSIS — J9610 Chronic respiratory failure, unspecified whether with hypoxia or hypercapnia: Secondary | ICD-10-CM | POA: Diagnosis not present

## 2016-11-26 DIAGNOSIS — R1314 Dysphagia, pharyngoesophageal phase: Secondary | ICD-10-CM | POA: Diagnosis not present

## 2016-11-27 DIAGNOSIS — J9610 Chronic respiratory failure, unspecified whether with hypoxia or hypercapnia: Secondary | ICD-10-CM | POA: Diagnosis not present

## 2016-11-27 DIAGNOSIS — G3184 Mild cognitive impairment, so stated: Secondary | ICD-10-CM | POA: Diagnosis not present

## 2016-11-27 DIAGNOSIS — R1314 Dysphagia, pharyngoesophageal phase: Secondary | ICD-10-CM | POA: Diagnosis not present

## 2016-11-27 DIAGNOSIS — G809 Cerebral palsy, unspecified: Secondary | ICD-10-CM | POA: Diagnosis not present

## 2016-11-27 DIAGNOSIS — M6281 Muscle weakness (generalized): Secondary | ICD-10-CM | POA: Diagnosis not present

## 2016-11-27 DIAGNOSIS — I639 Cerebral infarction, unspecified: Secondary | ICD-10-CM | POA: Diagnosis not present

## 2016-11-27 DIAGNOSIS — E43 Unspecified severe protein-calorie malnutrition: Secondary | ICD-10-CM | POA: Diagnosis not present

## 2016-11-27 DIAGNOSIS — Z515 Encounter for palliative care: Secondary | ICD-10-CM | POA: Diagnosis not present

## 2016-11-28 DIAGNOSIS — E43 Unspecified severe protein-calorie malnutrition: Secondary | ICD-10-CM | POA: Diagnosis not present

## 2016-11-28 DIAGNOSIS — Z515 Encounter for palliative care: Secondary | ICD-10-CM | POA: Diagnosis not present

## 2016-11-28 DIAGNOSIS — I639 Cerebral infarction, unspecified: Secondary | ICD-10-CM | POA: Diagnosis not present

## 2016-11-28 DIAGNOSIS — R1314 Dysphagia, pharyngoesophageal phase: Secondary | ICD-10-CM | POA: Diagnosis not present

## 2016-11-28 DIAGNOSIS — G809 Cerebral palsy, unspecified: Secondary | ICD-10-CM | POA: Diagnosis not present

## 2016-11-28 DIAGNOSIS — M6281 Muscle weakness (generalized): Secondary | ICD-10-CM | POA: Diagnosis not present

## 2016-11-28 DIAGNOSIS — G3184 Mild cognitive impairment, so stated: Secondary | ICD-10-CM | POA: Diagnosis not present

## 2016-11-28 DIAGNOSIS — J9610 Chronic respiratory failure, unspecified whether with hypoxia or hypercapnia: Secondary | ICD-10-CM | POA: Diagnosis not present

## 2016-11-29 DIAGNOSIS — J9610 Chronic respiratory failure, unspecified whether with hypoxia or hypercapnia: Secondary | ICD-10-CM | POA: Diagnosis not present

## 2016-11-29 DIAGNOSIS — Z515 Encounter for palliative care: Secondary | ICD-10-CM | POA: Diagnosis not present

## 2016-11-29 DIAGNOSIS — I639 Cerebral infarction, unspecified: Secondary | ICD-10-CM | POA: Diagnosis not present

## 2016-11-29 DIAGNOSIS — E43 Unspecified severe protein-calorie malnutrition: Secondary | ICD-10-CM | POA: Diagnosis not present

## 2016-11-29 DIAGNOSIS — M6281 Muscle weakness (generalized): Secondary | ICD-10-CM | POA: Diagnosis not present

## 2016-11-29 DIAGNOSIS — G809 Cerebral palsy, unspecified: Secondary | ICD-10-CM | POA: Diagnosis not present

## 2016-11-29 DIAGNOSIS — G3184 Mild cognitive impairment, so stated: Secondary | ICD-10-CM | POA: Diagnosis not present

## 2016-11-29 DIAGNOSIS — R1314 Dysphagia, pharyngoesophageal phase: Secondary | ICD-10-CM | POA: Diagnosis not present

## 2016-11-30 DIAGNOSIS — G3184 Mild cognitive impairment, so stated: Secondary | ICD-10-CM | POA: Diagnosis not present

## 2016-11-30 DIAGNOSIS — I639 Cerebral infarction, unspecified: Secondary | ICD-10-CM | POA: Diagnosis not present

## 2016-11-30 DIAGNOSIS — J9610 Chronic respiratory failure, unspecified whether with hypoxia or hypercapnia: Secondary | ICD-10-CM | POA: Diagnosis not present

## 2016-11-30 DIAGNOSIS — G809 Cerebral palsy, unspecified: Secondary | ICD-10-CM | POA: Diagnosis not present

## 2016-11-30 DIAGNOSIS — M6281 Muscle weakness (generalized): Secondary | ICD-10-CM | POA: Diagnosis not present

## 2016-11-30 DIAGNOSIS — Z515 Encounter for palliative care: Secondary | ICD-10-CM | POA: Diagnosis not present

## 2016-11-30 DIAGNOSIS — E43 Unspecified severe protein-calorie malnutrition: Secondary | ICD-10-CM | POA: Diagnosis not present

## 2016-11-30 DIAGNOSIS — R1314 Dysphagia, pharyngoesophageal phase: Secondary | ICD-10-CM | POA: Diagnosis not present

## 2016-12-01 DIAGNOSIS — Z515 Encounter for palliative care: Secondary | ICD-10-CM | POA: Diagnosis not present

## 2016-12-01 DIAGNOSIS — M6281 Muscle weakness (generalized): Secondary | ICD-10-CM | POA: Diagnosis not present

## 2016-12-01 DIAGNOSIS — R1314 Dysphagia, pharyngoesophageal phase: Secondary | ICD-10-CM | POA: Diagnosis not present

## 2016-12-01 DIAGNOSIS — G809 Cerebral palsy, unspecified: Secondary | ICD-10-CM | POA: Diagnosis not present

## 2016-12-01 DIAGNOSIS — I639 Cerebral infarction, unspecified: Secondary | ICD-10-CM | POA: Diagnosis not present

## 2016-12-01 DIAGNOSIS — E43 Unspecified severe protein-calorie malnutrition: Secondary | ICD-10-CM | POA: Diagnosis not present

## 2016-12-01 DIAGNOSIS — J9610 Chronic respiratory failure, unspecified whether with hypoxia or hypercapnia: Secondary | ICD-10-CM | POA: Diagnosis not present

## 2016-12-01 DIAGNOSIS — G3184 Mild cognitive impairment, so stated: Secondary | ICD-10-CM | POA: Diagnosis not present

## 2016-12-02 DIAGNOSIS — J9610 Chronic respiratory failure, unspecified whether with hypoxia or hypercapnia: Secondary | ICD-10-CM | POA: Diagnosis not present

## 2016-12-02 DIAGNOSIS — I639 Cerebral infarction, unspecified: Secondary | ICD-10-CM | POA: Diagnosis not present

## 2016-12-02 DIAGNOSIS — G3184 Mild cognitive impairment, so stated: Secondary | ICD-10-CM | POA: Diagnosis not present

## 2016-12-02 DIAGNOSIS — R1314 Dysphagia, pharyngoesophageal phase: Secondary | ICD-10-CM | POA: Diagnosis not present

## 2016-12-02 DIAGNOSIS — Z515 Encounter for palliative care: Secondary | ICD-10-CM | POA: Diagnosis not present

## 2016-12-02 DIAGNOSIS — M6281 Muscle weakness (generalized): Secondary | ICD-10-CM | POA: Diagnosis not present

## 2016-12-02 DIAGNOSIS — G809 Cerebral palsy, unspecified: Secondary | ICD-10-CM | POA: Diagnosis not present

## 2016-12-02 DIAGNOSIS — E43 Unspecified severe protein-calorie malnutrition: Secondary | ICD-10-CM | POA: Diagnosis not present

## 2016-12-03 DIAGNOSIS — M6281 Muscle weakness (generalized): Secondary | ICD-10-CM | POA: Diagnosis not present

## 2016-12-03 DIAGNOSIS — Z515 Encounter for palliative care: Secondary | ICD-10-CM | POA: Diagnosis not present

## 2016-12-03 DIAGNOSIS — R1314 Dysphagia, pharyngoesophageal phase: Secondary | ICD-10-CM | POA: Diagnosis not present

## 2016-12-03 DIAGNOSIS — J9610 Chronic respiratory failure, unspecified whether with hypoxia or hypercapnia: Secondary | ICD-10-CM | POA: Diagnosis not present

## 2016-12-03 DIAGNOSIS — I639 Cerebral infarction, unspecified: Secondary | ICD-10-CM | POA: Diagnosis not present

## 2016-12-03 DIAGNOSIS — G3184 Mild cognitive impairment, so stated: Secondary | ICD-10-CM | POA: Diagnosis not present

## 2016-12-03 DIAGNOSIS — E43 Unspecified severe protein-calorie malnutrition: Secondary | ICD-10-CM | POA: Diagnosis not present

## 2016-12-03 DIAGNOSIS — G809 Cerebral palsy, unspecified: Secondary | ICD-10-CM | POA: Diagnosis not present

## 2016-12-04 DIAGNOSIS — E43 Unspecified severe protein-calorie malnutrition: Secondary | ICD-10-CM | POA: Diagnosis not present

## 2016-12-04 DIAGNOSIS — J9610 Chronic respiratory failure, unspecified whether with hypoxia or hypercapnia: Secondary | ICD-10-CM | POA: Diagnosis not present

## 2016-12-04 DIAGNOSIS — G809 Cerebral palsy, unspecified: Secondary | ICD-10-CM | POA: Diagnosis not present

## 2016-12-04 DIAGNOSIS — I639 Cerebral infarction, unspecified: Secondary | ICD-10-CM | POA: Diagnosis not present

## 2016-12-04 DIAGNOSIS — M6281 Muscle weakness (generalized): Secondary | ICD-10-CM | POA: Diagnosis not present

## 2016-12-04 DIAGNOSIS — R1314 Dysphagia, pharyngoesophageal phase: Secondary | ICD-10-CM | POA: Diagnosis not present

## 2016-12-04 DIAGNOSIS — G3184 Mild cognitive impairment, so stated: Secondary | ICD-10-CM | POA: Diagnosis not present

## 2016-12-04 DIAGNOSIS — Z515 Encounter for palliative care: Secondary | ICD-10-CM | POA: Diagnosis not present

## 2016-12-05 DIAGNOSIS — M6281 Muscle weakness (generalized): Secondary | ICD-10-CM | POA: Diagnosis not present

## 2016-12-05 DIAGNOSIS — G809 Cerebral palsy, unspecified: Secondary | ICD-10-CM | POA: Diagnosis not present

## 2016-12-05 DIAGNOSIS — E43 Unspecified severe protein-calorie malnutrition: Secondary | ICD-10-CM | POA: Diagnosis not present

## 2016-12-05 DIAGNOSIS — G3184 Mild cognitive impairment, so stated: Secondary | ICD-10-CM | POA: Diagnosis not present

## 2016-12-05 DIAGNOSIS — Z515 Encounter for palliative care: Secondary | ICD-10-CM | POA: Diagnosis not present

## 2016-12-05 DIAGNOSIS — I639 Cerebral infarction, unspecified: Secondary | ICD-10-CM | POA: Diagnosis not present

## 2016-12-05 DIAGNOSIS — J9610 Chronic respiratory failure, unspecified whether with hypoxia or hypercapnia: Secondary | ICD-10-CM | POA: Diagnosis not present

## 2016-12-05 DIAGNOSIS — R1314 Dysphagia, pharyngoesophageal phase: Secondary | ICD-10-CM | POA: Diagnosis not present

## 2016-12-06 DIAGNOSIS — G809 Cerebral palsy, unspecified: Secondary | ICD-10-CM | POA: Diagnosis not present

## 2016-12-06 DIAGNOSIS — G3184 Mild cognitive impairment, so stated: Secondary | ICD-10-CM | POA: Diagnosis not present

## 2016-12-06 DIAGNOSIS — J9610 Chronic respiratory failure, unspecified whether with hypoxia or hypercapnia: Secondary | ICD-10-CM | POA: Diagnosis not present

## 2016-12-06 DIAGNOSIS — Z515 Encounter for palliative care: Secondary | ICD-10-CM | POA: Diagnosis not present

## 2016-12-06 DIAGNOSIS — R1314 Dysphagia, pharyngoesophageal phase: Secondary | ICD-10-CM | POA: Diagnosis not present

## 2016-12-06 DIAGNOSIS — I639 Cerebral infarction, unspecified: Secondary | ICD-10-CM | POA: Diagnosis not present

## 2016-12-06 DIAGNOSIS — E43 Unspecified severe protein-calorie malnutrition: Secondary | ICD-10-CM | POA: Diagnosis not present

## 2016-12-06 DIAGNOSIS — M6281 Muscle weakness (generalized): Secondary | ICD-10-CM | POA: Diagnosis not present

## 2016-12-07 DIAGNOSIS — M6281 Muscle weakness (generalized): Secondary | ICD-10-CM | POA: Diagnosis not present

## 2016-12-07 DIAGNOSIS — R1314 Dysphagia, pharyngoesophageal phase: Secondary | ICD-10-CM | POA: Diagnosis not present

## 2016-12-07 DIAGNOSIS — J9610 Chronic respiratory failure, unspecified whether with hypoxia or hypercapnia: Secondary | ICD-10-CM | POA: Diagnosis not present

## 2016-12-07 DIAGNOSIS — Z515 Encounter for palliative care: Secondary | ICD-10-CM | POA: Diagnosis not present

## 2016-12-07 DIAGNOSIS — E43 Unspecified severe protein-calorie malnutrition: Secondary | ICD-10-CM | POA: Diagnosis not present

## 2016-12-07 DIAGNOSIS — G809 Cerebral palsy, unspecified: Secondary | ICD-10-CM | POA: Diagnosis not present

## 2016-12-07 DIAGNOSIS — I639 Cerebral infarction, unspecified: Secondary | ICD-10-CM | POA: Diagnosis not present

## 2016-12-07 DIAGNOSIS — G3184 Mild cognitive impairment, so stated: Secondary | ICD-10-CM | POA: Diagnosis not present

## 2016-12-08 DIAGNOSIS — G809 Cerebral palsy, unspecified: Secondary | ICD-10-CM | POA: Diagnosis not present

## 2016-12-08 DIAGNOSIS — R1314 Dysphagia, pharyngoesophageal phase: Secondary | ICD-10-CM | POA: Diagnosis not present

## 2016-12-08 DIAGNOSIS — G3184 Mild cognitive impairment, so stated: Secondary | ICD-10-CM | POA: Diagnosis not present

## 2016-12-08 DIAGNOSIS — E43 Unspecified severe protein-calorie malnutrition: Secondary | ICD-10-CM | POA: Diagnosis not present

## 2016-12-08 DIAGNOSIS — I639 Cerebral infarction, unspecified: Secondary | ICD-10-CM | POA: Diagnosis not present

## 2016-12-08 DIAGNOSIS — J9610 Chronic respiratory failure, unspecified whether with hypoxia or hypercapnia: Secondary | ICD-10-CM | POA: Diagnosis not present

## 2016-12-08 DIAGNOSIS — Z515 Encounter for palliative care: Secondary | ICD-10-CM | POA: Diagnosis not present

## 2016-12-08 DIAGNOSIS — M6281 Muscle weakness (generalized): Secondary | ICD-10-CM | POA: Diagnosis not present

## 2016-12-09 DIAGNOSIS — M6281 Muscle weakness (generalized): Secondary | ICD-10-CM | POA: Diagnosis not present

## 2016-12-09 DIAGNOSIS — E43 Unspecified severe protein-calorie malnutrition: Secondary | ICD-10-CM | POA: Diagnosis not present

## 2016-12-09 DIAGNOSIS — G809 Cerebral palsy, unspecified: Secondary | ICD-10-CM | POA: Diagnosis not present

## 2016-12-09 DIAGNOSIS — J9610 Chronic respiratory failure, unspecified whether with hypoxia or hypercapnia: Secondary | ICD-10-CM | POA: Diagnosis not present

## 2016-12-09 DIAGNOSIS — R1314 Dysphagia, pharyngoesophageal phase: Secondary | ICD-10-CM | POA: Diagnosis not present

## 2016-12-09 DIAGNOSIS — Z515 Encounter for palliative care: Secondary | ICD-10-CM | POA: Diagnosis not present

## 2016-12-09 DIAGNOSIS — I639 Cerebral infarction, unspecified: Secondary | ICD-10-CM | POA: Diagnosis not present

## 2016-12-09 DIAGNOSIS — G3184 Mild cognitive impairment, so stated: Secondary | ICD-10-CM | POA: Diagnosis not present

## 2016-12-10 DIAGNOSIS — I639 Cerebral infarction, unspecified: Secondary | ICD-10-CM | POA: Diagnosis not present

## 2016-12-10 DIAGNOSIS — R1314 Dysphagia, pharyngoesophageal phase: Secondary | ICD-10-CM | POA: Diagnosis not present

## 2016-12-10 DIAGNOSIS — G3184 Mild cognitive impairment, so stated: Secondary | ICD-10-CM | POA: Diagnosis not present

## 2016-12-10 DIAGNOSIS — J9610 Chronic respiratory failure, unspecified whether with hypoxia or hypercapnia: Secondary | ICD-10-CM | POA: Diagnosis not present

## 2016-12-10 DIAGNOSIS — G809 Cerebral palsy, unspecified: Secondary | ICD-10-CM | POA: Diagnosis not present

## 2016-12-10 DIAGNOSIS — Z515 Encounter for palliative care: Secondary | ICD-10-CM | POA: Diagnosis not present

## 2016-12-10 DIAGNOSIS — M6281 Muscle weakness (generalized): Secondary | ICD-10-CM | POA: Diagnosis not present

## 2016-12-10 DIAGNOSIS — E43 Unspecified severe protein-calorie malnutrition: Secondary | ICD-10-CM | POA: Diagnosis not present

## 2016-12-11 DIAGNOSIS — G809 Cerebral palsy, unspecified: Secondary | ICD-10-CM | POA: Diagnosis not present

## 2016-12-11 DIAGNOSIS — G3184 Mild cognitive impairment, so stated: Secondary | ICD-10-CM | POA: Diagnosis not present

## 2016-12-11 DIAGNOSIS — J9610 Chronic respiratory failure, unspecified whether with hypoxia or hypercapnia: Secondary | ICD-10-CM | POA: Diagnosis not present

## 2016-12-11 DIAGNOSIS — E43 Unspecified severe protein-calorie malnutrition: Secondary | ICD-10-CM | POA: Diagnosis not present

## 2016-12-11 DIAGNOSIS — R1314 Dysphagia, pharyngoesophageal phase: Secondary | ICD-10-CM | POA: Diagnosis not present

## 2016-12-11 DIAGNOSIS — I639 Cerebral infarction, unspecified: Secondary | ICD-10-CM | POA: Diagnosis not present

## 2016-12-11 DIAGNOSIS — M6281 Muscle weakness (generalized): Secondary | ICD-10-CM | POA: Diagnosis not present

## 2016-12-11 DIAGNOSIS — Z515 Encounter for palliative care: Secondary | ICD-10-CM | POA: Diagnosis not present

## 2016-12-12 DIAGNOSIS — I639 Cerebral infarction, unspecified: Secondary | ICD-10-CM | POA: Diagnosis not present

## 2016-12-12 DIAGNOSIS — E43 Unspecified severe protein-calorie malnutrition: Secondary | ICD-10-CM | POA: Diagnosis not present

## 2016-12-12 DIAGNOSIS — G809 Cerebral palsy, unspecified: Secondary | ICD-10-CM | POA: Diagnosis not present

## 2016-12-12 DIAGNOSIS — Z515 Encounter for palliative care: Secondary | ICD-10-CM | POA: Diagnosis not present

## 2016-12-12 DIAGNOSIS — M6281 Muscle weakness (generalized): Secondary | ICD-10-CM | POA: Diagnosis not present

## 2016-12-12 DIAGNOSIS — R1314 Dysphagia, pharyngoesophageal phase: Secondary | ICD-10-CM | POA: Diagnosis not present

## 2016-12-12 DIAGNOSIS — J9610 Chronic respiratory failure, unspecified whether with hypoxia or hypercapnia: Secondary | ICD-10-CM | POA: Diagnosis not present

## 2016-12-12 DIAGNOSIS — G3184 Mild cognitive impairment, so stated: Secondary | ICD-10-CM | POA: Diagnosis not present

## 2016-12-13 DIAGNOSIS — I639 Cerebral infarction, unspecified: Secondary | ICD-10-CM | POA: Diagnosis not present

## 2016-12-13 DIAGNOSIS — M6281 Muscle weakness (generalized): Secondary | ICD-10-CM | POA: Diagnosis not present

## 2016-12-13 DIAGNOSIS — J9610 Chronic respiratory failure, unspecified whether with hypoxia or hypercapnia: Secondary | ICD-10-CM | POA: Diagnosis not present

## 2016-12-13 DIAGNOSIS — G3184 Mild cognitive impairment, so stated: Secondary | ICD-10-CM | POA: Diagnosis not present

## 2016-12-13 DIAGNOSIS — Z515 Encounter for palliative care: Secondary | ICD-10-CM | POA: Diagnosis not present

## 2016-12-13 DIAGNOSIS — G809 Cerebral palsy, unspecified: Secondary | ICD-10-CM | POA: Diagnosis not present

## 2016-12-13 DIAGNOSIS — E43 Unspecified severe protein-calorie malnutrition: Secondary | ICD-10-CM | POA: Diagnosis not present

## 2016-12-13 DIAGNOSIS — R1314 Dysphagia, pharyngoesophageal phase: Secondary | ICD-10-CM | POA: Diagnosis not present

## 2016-12-14 DIAGNOSIS — M6281 Muscle weakness (generalized): Secondary | ICD-10-CM | POA: Diagnosis not present

## 2016-12-14 DIAGNOSIS — G809 Cerebral palsy, unspecified: Secondary | ICD-10-CM | POA: Diagnosis not present

## 2016-12-14 DIAGNOSIS — G3184 Mild cognitive impairment, so stated: Secondary | ICD-10-CM | POA: Diagnosis not present

## 2016-12-14 DIAGNOSIS — I639 Cerebral infarction, unspecified: Secondary | ICD-10-CM | POA: Diagnosis not present

## 2016-12-14 DIAGNOSIS — Z515 Encounter for palliative care: Secondary | ICD-10-CM | POA: Diagnosis not present

## 2016-12-14 DIAGNOSIS — R1314 Dysphagia, pharyngoesophageal phase: Secondary | ICD-10-CM | POA: Diagnosis not present

## 2016-12-14 DIAGNOSIS — J9610 Chronic respiratory failure, unspecified whether with hypoxia or hypercapnia: Secondary | ICD-10-CM | POA: Diagnosis not present

## 2016-12-14 DIAGNOSIS — E43 Unspecified severe protein-calorie malnutrition: Secondary | ICD-10-CM | POA: Diagnosis not present

## 2016-12-15 DIAGNOSIS — Z515 Encounter for palliative care: Secondary | ICD-10-CM | POA: Diagnosis not present

## 2016-12-15 DIAGNOSIS — G809 Cerebral palsy, unspecified: Secondary | ICD-10-CM | POA: Diagnosis not present

## 2016-12-15 DIAGNOSIS — I639 Cerebral infarction, unspecified: Secondary | ICD-10-CM | POA: Diagnosis not present

## 2016-12-15 DIAGNOSIS — J9610 Chronic respiratory failure, unspecified whether with hypoxia or hypercapnia: Secondary | ICD-10-CM | POA: Diagnosis not present

## 2016-12-15 DIAGNOSIS — E43 Unspecified severe protein-calorie malnutrition: Secondary | ICD-10-CM | POA: Diagnosis not present

## 2016-12-15 DIAGNOSIS — G3184 Mild cognitive impairment, so stated: Secondary | ICD-10-CM | POA: Diagnosis not present

## 2016-12-15 DIAGNOSIS — R1314 Dysphagia, pharyngoesophageal phase: Secondary | ICD-10-CM | POA: Diagnosis not present

## 2016-12-15 DIAGNOSIS — M6281 Muscle weakness (generalized): Secondary | ICD-10-CM | POA: Diagnosis not present

## 2016-12-16 DIAGNOSIS — J9610 Chronic respiratory failure, unspecified whether with hypoxia or hypercapnia: Secondary | ICD-10-CM | POA: Diagnosis not present

## 2016-12-16 DIAGNOSIS — E43 Unspecified severe protein-calorie malnutrition: Secondary | ICD-10-CM | POA: Diagnosis not present

## 2016-12-16 DIAGNOSIS — G809 Cerebral palsy, unspecified: Secondary | ICD-10-CM | POA: Diagnosis not present

## 2016-12-16 DIAGNOSIS — I639 Cerebral infarction, unspecified: Secondary | ICD-10-CM | POA: Diagnosis not present

## 2016-12-16 DIAGNOSIS — R1314 Dysphagia, pharyngoesophageal phase: Secondary | ICD-10-CM | POA: Diagnosis not present

## 2016-12-16 DIAGNOSIS — M6281 Muscle weakness (generalized): Secondary | ICD-10-CM | POA: Diagnosis not present

## 2016-12-16 DIAGNOSIS — Z515 Encounter for palliative care: Secondary | ICD-10-CM | POA: Diagnosis not present

## 2016-12-16 DIAGNOSIS — G3184 Mild cognitive impairment, so stated: Secondary | ICD-10-CM | POA: Diagnosis not present

## 2016-12-17 DIAGNOSIS — I639 Cerebral infarction, unspecified: Secondary | ICD-10-CM | POA: Diagnosis not present

## 2016-12-17 DIAGNOSIS — E43 Unspecified severe protein-calorie malnutrition: Secondary | ICD-10-CM | POA: Diagnosis not present

## 2016-12-17 DIAGNOSIS — J9610 Chronic respiratory failure, unspecified whether with hypoxia or hypercapnia: Secondary | ICD-10-CM | POA: Diagnosis not present

## 2016-12-17 DIAGNOSIS — G3184 Mild cognitive impairment, so stated: Secondary | ICD-10-CM | POA: Diagnosis not present

## 2016-12-17 DIAGNOSIS — M6281 Muscle weakness (generalized): Secondary | ICD-10-CM | POA: Diagnosis not present

## 2016-12-17 DIAGNOSIS — G809 Cerebral palsy, unspecified: Secondary | ICD-10-CM | POA: Diagnosis not present

## 2016-12-17 DIAGNOSIS — Z515 Encounter for palliative care: Secondary | ICD-10-CM | POA: Diagnosis not present

## 2016-12-17 DIAGNOSIS — R1314 Dysphagia, pharyngoesophageal phase: Secondary | ICD-10-CM | POA: Diagnosis not present

## 2016-12-18 DIAGNOSIS — J9610 Chronic respiratory failure, unspecified whether with hypoxia or hypercapnia: Secondary | ICD-10-CM | POA: Diagnosis not present

## 2016-12-18 DIAGNOSIS — I639 Cerebral infarction, unspecified: Secondary | ICD-10-CM | POA: Diagnosis not present

## 2016-12-18 DIAGNOSIS — Z515 Encounter for palliative care: Secondary | ICD-10-CM | POA: Diagnosis not present

## 2016-12-18 DIAGNOSIS — G3184 Mild cognitive impairment, so stated: Secondary | ICD-10-CM | POA: Diagnosis not present

## 2016-12-18 DIAGNOSIS — R1314 Dysphagia, pharyngoesophageal phase: Secondary | ICD-10-CM | POA: Diagnosis not present

## 2016-12-18 DIAGNOSIS — G809 Cerebral palsy, unspecified: Secondary | ICD-10-CM | POA: Diagnosis not present

## 2016-12-18 DIAGNOSIS — M6281 Muscle weakness (generalized): Secondary | ICD-10-CM | POA: Diagnosis not present

## 2016-12-18 DIAGNOSIS — E43 Unspecified severe protein-calorie malnutrition: Secondary | ICD-10-CM | POA: Diagnosis not present

## 2016-12-19 DIAGNOSIS — Z515 Encounter for palliative care: Secondary | ICD-10-CM | POA: Diagnosis not present

## 2016-12-19 DIAGNOSIS — G3184 Mild cognitive impairment, so stated: Secondary | ICD-10-CM | POA: Diagnosis not present

## 2016-12-19 DIAGNOSIS — I639 Cerebral infarction, unspecified: Secondary | ICD-10-CM | POA: Diagnosis not present

## 2016-12-19 DIAGNOSIS — R1314 Dysphagia, pharyngoesophageal phase: Secondary | ICD-10-CM | POA: Diagnosis not present

## 2016-12-19 DIAGNOSIS — E43 Unspecified severe protein-calorie malnutrition: Secondary | ICD-10-CM | POA: Diagnosis not present

## 2016-12-19 DIAGNOSIS — J9610 Chronic respiratory failure, unspecified whether with hypoxia or hypercapnia: Secondary | ICD-10-CM | POA: Diagnosis not present

## 2016-12-19 DIAGNOSIS — M6281 Muscle weakness (generalized): Secondary | ICD-10-CM | POA: Diagnosis not present

## 2016-12-19 DIAGNOSIS — G809 Cerebral palsy, unspecified: Secondary | ICD-10-CM | POA: Diagnosis not present

## 2016-12-20 DIAGNOSIS — E43 Unspecified severe protein-calorie malnutrition: Secondary | ICD-10-CM | POA: Diagnosis not present

## 2016-12-20 DIAGNOSIS — J9610 Chronic respiratory failure, unspecified whether with hypoxia or hypercapnia: Secondary | ICD-10-CM | POA: Diagnosis not present

## 2016-12-20 DIAGNOSIS — G809 Cerebral palsy, unspecified: Secondary | ICD-10-CM | POA: Diagnosis not present

## 2016-12-20 DIAGNOSIS — G3184 Mild cognitive impairment, so stated: Secondary | ICD-10-CM | POA: Diagnosis not present

## 2016-12-20 DIAGNOSIS — Z515 Encounter for palliative care: Secondary | ICD-10-CM | POA: Diagnosis not present

## 2016-12-20 DIAGNOSIS — I639 Cerebral infarction, unspecified: Secondary | ICD-10-CM | POA: Diagnosis not present

## 2016-12-20 DIAGNOSIS — R0682 Tachypnea, not elsewhere classified: Secondary | ICD-10-CM | POA: Diagnosis not present

## 2016-12-20 DIAGNOSIS — R1314 Dysphagia, pharyngoesophageal phase: Secondary | ICD-10-CM | POA: Diagnosis not present

## 2016-12-20 DIAGNOSIS — M6281 Muscle weakness (generalized): Secondary | ICD-10-CM | POA: Diagnosis not present

## 2016-12-29 DEATH — deceased

## 2017-11-01 IMAGING — CR DG CHEST 1V PORT
1 series · 1 of 1 positions shown · non-contrast
Comparison: 01/10/2016

CLINICAL DATA: Atrial fibrillation

EXAM:
PORTABLE CHEST 1 VIEW

[portable]
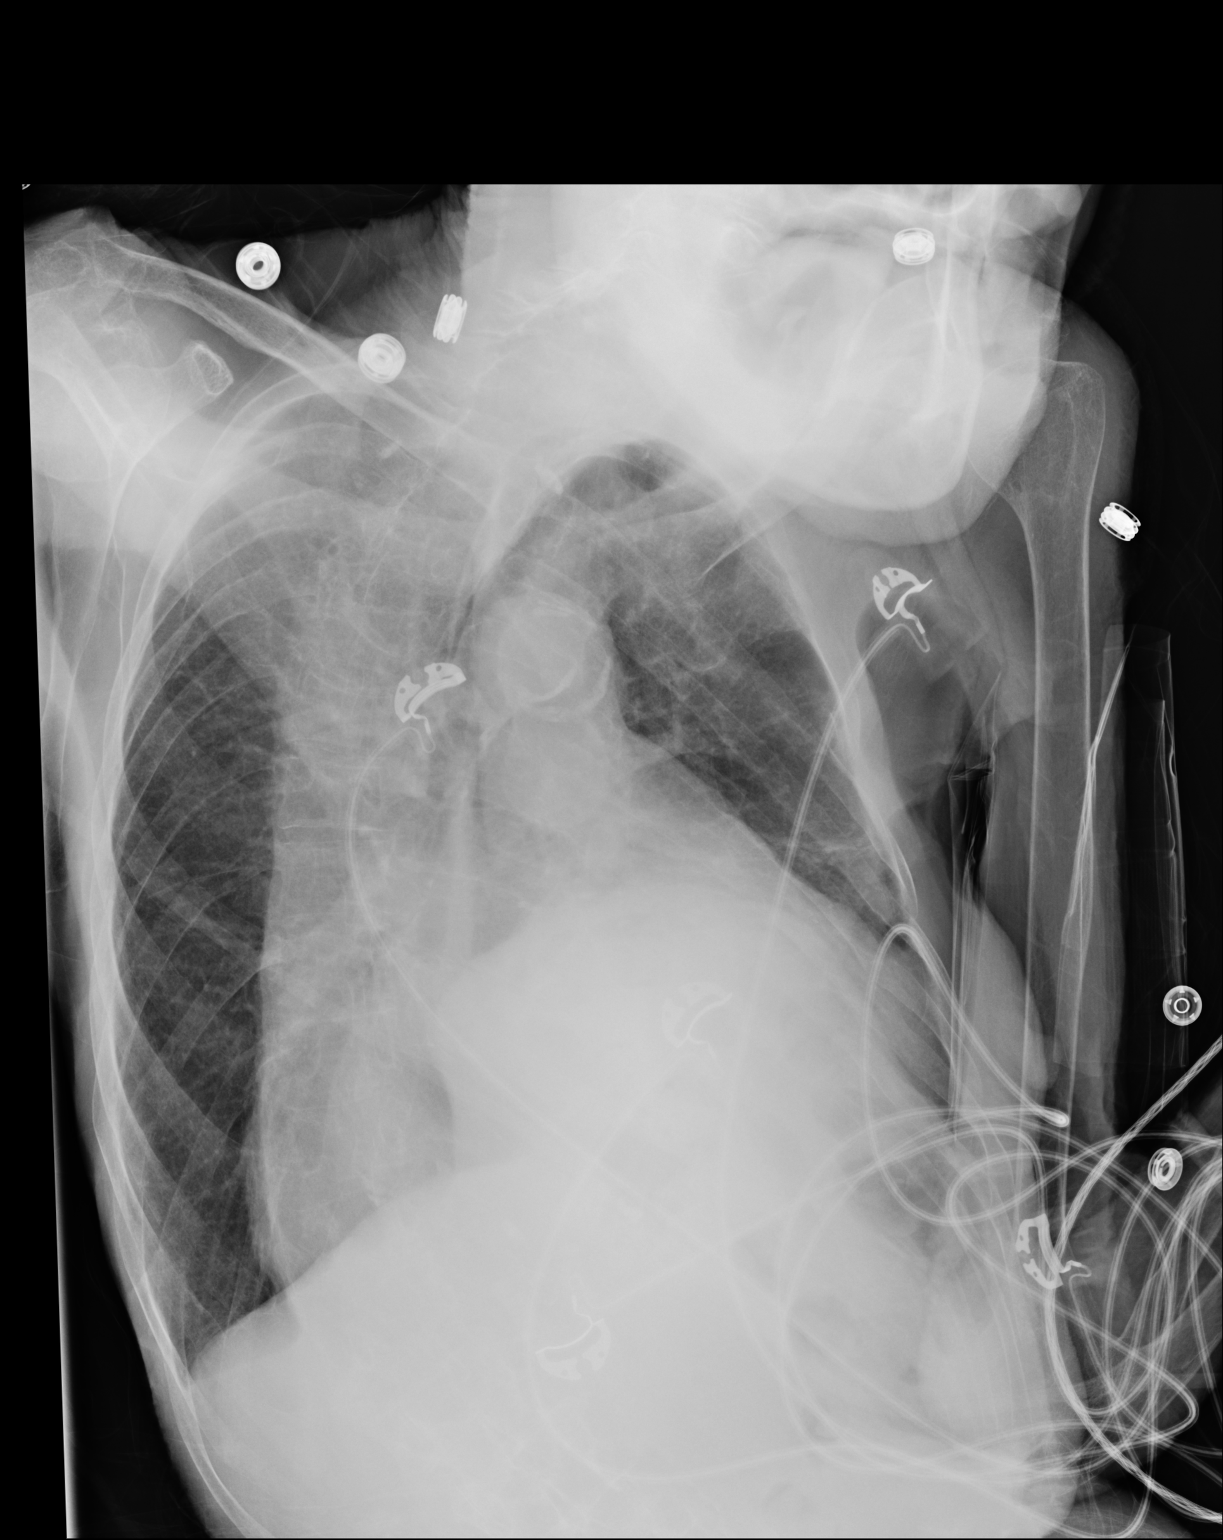

[1 of 1 positions shown; findings below may reference images not displayed]

FINDINGS: Examination is limited by habitus and positioning. The right lung is
grossly clear. There is streaky atelectasis at the left base with
probable elevation of the left diaphragm. Cardiomegaly with aortic
atherosclerosis.
IMPRESSION: 1. Elevated left diaphragm with streaky atelectasis at the left
base.
2. Suspect that there is cardiomegaly when allowing for rotation and
positioning.
